# Patient Record
Sex: Female | Born: 1990 | Race: Black or African American | Hispanic: Refuse to answer | Marital: Single
Health system: Midwestern US, Community
[De-identification: ages and names within clinical notes are randomized; demographics above are authoritative.]

## PROBLEM LIST (undated history)

## (undated) ENCOUNTER — Inpatient Hospital Stay (HOSPITAL_COMMUNITY): Payer: Self-pay

## (undated) DIAGNOSIS — N938 Other specified abnormal uterine and vaginal bleeding: Secondary | ICD-10-CM

## (undated) DIAGNOSIS — E785 Hyperlipidemia, unspecified: Secondary | ICD-10-CM

## (undated) DIAGNOSIS — I10 Essential (primary) hypertension: Secondary | ICD-10-CM

## (undated) DIAGNOSIS — R519 Headache, unspecified: Secondary | ICD-10-CM

## (undated) DIAGNOSIS — R51 Headache: Secondary | ICD-10-CM

## (undated) HISTORY — PX: NO PAST SURGERIES: SHX2092

## (undated) HISTORY — DX: Essential (primary) hypertension: I10

## (undated) HISTORY — DX: Hyperlipidemia, unspecified: E78.5

---

## 2010-05-17 ENCOUNTER — Emergency Department: Payer: Self-pay | Admitting: Emergency Medicine

## 2011-03-12 LAB — URINALYSIS W/ RFLX MICROSCOPIC
Bilirubin: NEGATIVE
Glucose: NEGATIVE MG/DL
Ketone: NEGATIVE MG/DL
Leukocyte Esterase: NEGATIVE
Nitrites: NEGATIVE
Protein: NEGATIVE MG/DL
Specific gravity: >1.03 — ABNORMAL HIGH (ref 1.003–1.030)
Urobilinogen: 0.2 EU/DL (ref 0.2–1.0)
pH (UA): 6 (ref 5.0–8.0)

## 2011-03-12 LAB — CBC WITH AUTOMATED DIFF
ABS. BASOPHILS: 0.1 10*3/uL — ABNORMAL HIGH (ref 0.0–0.06)
ABS. EOSINOPHILS: 0.5 10*3/uL — ABNORMAL HIGH (ref 0.0–0.4)
ABS. LYMPHOCYTES: 3.4 10*3/uL (ref 0.9–3.6)
ABS. MONOCYTES: 0.8 10*3/uL (ref 0.05–1.2)
ABS. NEUTROPHILS: 4.6 10*3/uL (ref 1.8–8.0)
BASOPHILS: 1 % (ref 0–2)
EOSINOPHILS: 5 % (ref 0–5)
HCT: 36.2 % (ref 35.0–45.0)
HGB: 12.1 g/dL (ref 12.0–16.0)
LYMPHOCYTES: 37 % (ref 21–52)
MCH: 26.8 PG (ref 24.0–34.0)
MCHC: 33.4 g/dL (ref 31.0–37.0)
MCV: 80.3 FL (ref 74.0–97.0)
MONOCYTES: 8 % (ref 3–10)
MPV: 11.1 FL (ref 9.2–11.8)
NEUTROPHILS: 49 % (ref 40–73)
PLATELET: 228 10*3/uL (ref 135–420)
RBC: 4.51 M/uL (ref 4.20–5.30)
RDW: 12 % (ref 11.6–14.5)
WBC: 9.3 10*3/uL (ref 4.6–13.2)

## 2011-03-12 LAB — URINE MICROSCOPIC ONLY: WBC: NEGATIVE /HPF (ref 0–4)

## 2011-03-12 LAB — HCG URINE, QL: HCG urine, QL: NEGATIVE

## 2011-03-12 LAB — WET PREP: Wet prep: NONE SEEN

## 2011-03-14 LAB — CHLAMYDIA/GC PCR
Chlamydia amplified: NEGATIVE
N. gonorrhea, amplified: NEGATIVE

## 2012-02-23 ENCOUNTER — Encounter

## 2012-05-12 ENCOUNTER — Encounter

## 2013-08-29 ENCOUNTER — Emergency Department: Payer: Self-pay | Admitting: Emergency Medicine

## 2013-08-29 LAB — COMPREHENSIVE METABOLIC PANEL
Alkaline Phosphatase: 56 U/L
BUN: 11 mg/dL (ref 7–18)
Bilirubin,Total: 0.3 mg/dL (ref 0.2–1.0)
Co2: 27 mmol/L (ref 21–32)
Creatinine: 0.89 mg/dL (ref 0.60–1.30)
EGFR (Non-African Amer.): >60
Glucose: 83 mg/dL (ref 65–99)
Potassium: 3.1 mmol/L — ABNORMAL LOW (ref 3.5–5.1)
SGPT (ALT): 17 U/L (ref 12–78)
Sodium: 137 mmol/L (ref 136–145)

## 2013-08-29 LAB — CBC
HCT: 36.4 % (ref 35.0–47.0)
MCH: 27.3 pg (ref 26.0–34.0)
MCHC: 32.9 g/dL (ref 32.0–36.0)
MCV: 83 fL (ref 80–100)
Platelet: 200 10*3/uL (ref 150–440)
RBC: 4.39 10*6/uL (ref 3.80–5.20)
WBC: 8.3 10*3/uL (ref 3.6–11.0)

## 2013-09-08 ENCOUNTER — Emergency Department (HOSPITAL_COMMUNITY)
Admission: EM | Admit: 2013-09-08 | Discharge: 2013-09-08 | Disposition: A | Discharge status: Home / Self Care | Payer: Self-pay | Attending: Emergency Medicine | Admitting: Emergency Medicine

## 2013-09-08 ENCOUNTER — Encounter (HOSPITAL_COMMUNITY): Payer: Self-pay | Admitting: Emergency Medicine

## 2013-09-08 ENCOUNTER — Emergency Department (HOSPITAL_COMMUNITY): Payer: Self-pay

## 2013-09-08 DIAGNOSIS — Z880 Allergy status to penicillin: Secondary | ICD-10-CM | POA: Insufficient documentation

## 2013-09-08 DIAGNOSIS — N760 Acute vaginitis: Secondary | ICD-10-CM | POA: Insufficient documentation

## 2013-09-08 DIAGNOSIS — B9689 Other specified bacterial agents as the cause of diseases classified elsewhere: Secondary | ICD-10-CM

## 2013-09-08 DIAGNOSIS — O2390 Unspecified genitourinary tract infection in pregnancy, unspecified trimester: Secondary | ICD-10-CM | POA: Insufficient documentation

## 2013-09-08 DIAGNOSIS — O200 Threatened abortion: Secondary | ICD-10-CM | POA: Insufficient documentation

## 2013-09-08 LAB — WET PREP, GENITAL
Trich, Wet Prep: NONE SEEN
Yeast Wet Prep HPF POC: NONE SEEN

## 2013-09-08 LAB — CBC
HCT: 35.5 % — ABNORMAL LOW (ref 36.0–46.0)
Hemoglobin: 11.8 g/dL — ABNORMAL LOW (ref 12.0–15.0)
MCH: 27.4 pg (ref 26.0–34.0)
MCHC: 33.2 g/dL (ref 30.0–36.0)
MCV: 82.4 fL (ref 78.0–100.0)
RDW: 12.7 % (ref 11.5–15.5)

## 2013-09-08 LAB — POCT PREGNANCY, URINE: Preg Test, Ur: POSITIVE — AB

## 2013-09-08 LAB — URINALYSIS, ROUTINE W REFLEX MICROSCOPIC
Bilirubin Urine: NEGATIVE
Glucose, UA: NEGATIVE mg/dL
Hgb urine dipstick: NEGATIVE
Ketones, ur: NEGATIVE mg/dL
Leukocytes, UA: NEGATIVE
Nitrite: NEGATIVE
pH: 5 (ref 5.0–8.0)

## 2013-09-08 MED ORDER — METRONIDAZOLE 1 % EX CREA: TOPICAL_CREAM | Freq: Every day | CUTANEOUS | Status: DC

## 2013-09-08 NOTE — ED Provider Notes (Signed)
Medical screening examination/treatment/procedure(s) were performed by non-physician practitioner and as supervising physician I was immediately available for consultation/collaboration.  EKG Interpretation   None         Gwyneth Sprout, MD 09/08/13 2142

## 2013-09-08 NOTE — ED Provider Notes (Signed)
Pt signed out to me at shift change. Pt with positive pregnancy test last week, today with lower abdominal cramping and vaginal bleeding. Pt's evaluation showed positive pregnancy test here in ED, US showed intrauterine gestational sac measuring 6w and 0d, no embryo visualized. Pt's HCG quant pending.    9:34 PM HCG back. PT will be d/c home with close GYN follow up. Discussed results with pt, questions answered.    Lottie Mussel, PA-C 09/08/13 2135

## 2013-09-08 NOTE — ED Notes (Signed)
Pt reports a positive pregnancy test last weeks, she guessed she was 5-[redacted] weeks pregnant. Then today she had one episode of vaginal bleeding and cramping, she took ibuprofen which helped the cramps and the bleeding has stopped.

## 2013-09-08 NOTE — ED Provider Notes (Signed)
CSN: [id number]     Arrival date & time 09/08/13  1452 History   First MD Initiated Contact with Patient 09/08/13 1748     Chief Complaint  Patient presents with  . Vaginal Bleeding   (Consider location/radiation/quality/duration/timing/severity/associated sxs/prior Treatment) HPI Comments: 22 year old female with vaginal bleeding in the setting of a recent positive pregnancy test. She began having abdominal cramping 2 days ago. Yesterday she began having vaginal bleeding. Today, she notes that she passed a small grayish substance. She still had some cramping since then, but her vaginal bleeding has resolved.  Patient is a 22 y.o. female presenting with female genitourinary complaint.  Female GU Problem This is a new problem. The current episode started 2 days ago. The problem occurs constantly. The problem has been gradually worsening. Associated symptoms include abdominal pain (Cramping, lower abdomen, moderate). Pertinent negatives include no chest pain and no shortness of breath. Nothing aggravates the symptoms. Nothing relieves the symptoms.    History reviewed. No pertinent past medical history. History reviewed. No pertinent past surgical history. History reviewed. No pertinent family history. History  Substance Use Topics  . Smoking status: Never Smoker   . Smokeless tobacco: Not on file  . Alcohol Use: No   OB History   Grav Para Term Preterm Abortions TAB SAB Ect Mult Living                 Review of Systems  Constitutional: Negative for fever.  HENT: Negative for congestion.   Respiratory: Negative for cough and shortness of breath.   Cardiovascular: Negative for chest pain.  Gastrointestinal: Positive for abdominal pain (Cramping, lower abdomen, moderate). Negative for nausea, vomiting and diarrhea.  All other systems reviewed and are negative.    Allergies  Amoxicillin and Peach  Home Medications   Current Outpatient Rx  Name  Route  Sig  Dispense  Refill  .  ibuprofen (ADVIL,MOTRIN) 200 MG tablet   Oral   Take 600 mg by mouth daily as needed (pain).          BP 133/89  Pulse 98  Temp(Src) 98.3 F (36.8 C) (Oral)  Resp 18  SpO2 100% Physical Exam  Nursing note and vitals reviewed. Constitutional: She is oriented to person, place, and time. She appears well-developed and well-nourished. No distress.  HENT:  Head: Normocephalic and atraumatic.  Mouth/Throat: Oropharynx is clear and moist.  Eyes: Conjunctivae are normal. Pupils are equal, round, and reactive to light. No scleral icterus.  Neck: Neck supple.  Cardiovascular: Normal rate, regular rhythm, normal heart sounds and intact distal pulses.   No murmur heard. Pulmonary/Chest: Effort normal and breath sounds normal. No stridor. No respiratory distress. She has no rales.  Abdominal: Soft. Bowel sounds are normal. She exhibits no distension. There is tenderness in the right lower quadrant and suprapubic area. There is no rigidity, no rebound and no guarding.  Genitourinary: Uterus is tender (Mild). Uterus is not enlarged. Cervix exhibits discharge (white). Cervix exhibits no motion tenderness and no friability. Right adnexum displays tenderness (Mild). Right adnexum displays no mass and no fullness. Left adnexum displays no mass, no tenderness and no fullness.  Musculoskeletal: Normal range of motion.  Neurological: She is alert and oriented to person, place, and time.  Skin: Skin is warm and dry. No rash noted.  Psychiatric: She has a normal mood and affect. Her behavior is normal.    ED Course  Procedures (including critical care time) Labs Review Labs Reviewed  WET PREP, GENITAL -  Abnormal; Notable for the following:    Clue Cells Wet Prep HPF POC MANY (*)    WBC, Wet Prep HPF POC MODERATE (*)    All other components within normal limits  URINALYSIS, ROUTINE W REFLEX MICROSCOPIC - Abnormal; Notable for the following:    APPearance CLOUDY (*)    Specific Gravity, Urine 1.034  (*)    All other components within normal limits  CBC - Abnormal; Notable for the following:    Hemoglobin 11.8 (*)    HCT 35.5 (*)    All other components within normal limits  HCG, QUANTITATIVE, PREGNANCY - Abnormal; Notable for the following:    hCG, Beta Chain, Quant, S 12851 (*)    All other components within normal limits  POCT PREGNANCY, URINE - Abnormal; Notable for the following:    Preg Test, Ur POSITIVE (*)    All other components within normal limits  GC/CHLAMYDIA PROBE AMP  ABO/RH   Imaging Review US Ob Comp Less 14 Wks  09/08/2013   CLINICAL DATA:  Pelvic pain, cramping, and bleeding. Five week 5 day gestational age by LMP.  EXAM: OBSTETRIC <14 WK [place] AND TRANSVAGINAL OB US  TECHNIQUE: Both transabdominal and transvaginal ultrasound examinations were performed for complete evaluation of the gestation as well as the maternal uterus, adnexal regions, and pelvic cul-de-sac. Transvaginal technique was performed to assess early pregnancy.  COMPARISON:  None.  FINDINGS: Intrauterine gestational sac: Visualized/normal in shape.  Yolk sac:  Visualized  Embryo:  Not visualized  MSD:  12  mm   6 w   0  d  Maternal uterus/adnexae: Both ovaries are normal in appearance. No adnexal mass or free fluid identified.  IMPRESSION: Single intrauterine gestational sac measuring 6 weeks 0 days by mean sac diameter, which is concordant with LMP.  No significant maternal uterine or adnexal abnormality identified.   Electronically Signed   By: Myles Rosenthal M.D.   On: 09/08/2013 20:10   US Ob Transvaginal  09/08/2013   CLINICAL DATA:  Pelvic pain, cramping, and bleeding. Five week 5 day gestational age by LMP.  EXAM: OBSTETRIC <14 WK [place] AND TRANSVAGINAL OB US  TECHNIQUE: Both transabdominal and transvaginal ultrasound examinations were performed for complete evaluation of the gestation as well as the maternal uterus, adnexal regions, and pelvic cul-de-sac. Transvaginal technique was performed to assess early  pregnancy.  COMPARISON:  None.  FINDINGS: Intrauterine gestational sac: Visualized/normal in shape.  Yolk sac:  Visualized  Embryo:  Not visualized  MSD:  12  mm   6 w   0  d  Maternal uterus/adnexae: Both ovaries are normal in appearance. No adnexal mass or free fluid identified.  IMPRESSION: Single intrauterine gestational sac measuring 6 weeks 0 days by mean sac diameter, which is concordant with LMP.  No significant maternal uterine or adnexal abnormality identified.   Electronically Signed   By: Myles Rosenthal M.D.   On: 09/08/2013 20:10    EKG Interpretation   None       MDM   1. Threatened abortion   2. BV (bacterial vaginosis)    Positive pregnancy test.  Mild right lower and suprapubic abdominal tenderness.  Patient reports passing gray substance, which could represent an abortion. Plan ultrasound to further evaluate.  US showed intrauterine gest sac. Plan for follow up with gynecology.  Waiting for bHCG, which PA Kirichenko will follow up on.    Candyce Churn, MD 09/08/13 2207

## 2013-09-09 LAB — GC/CHLAMYDIA PROBE AMP
CT Probe RNA: NEGATIVE
GC Probe RNA: NEGATIVE

## 2014-07-31 ENCOUNTER — Encounter (HOSPITAL_COMMUNITY): Payer: Self-pay | Admitting: Emergency Medicine

## 2014-07-31 ENCOUNTER — Emergency Department (HOSPITAL_COMMUNITY)
Admission: EM | Admit: 2014-07-31 | Discharge: 2014-07-31 | Disposition: A | Discharge status: Home / Self Care | Payer: 59 | Source: Home / Self Care | Attending: Family Medicine | Admitting: Family Medicine

## 2014-07-31 DIAGNOSIS — J069 Acute upper respiratory infection, unspecified: Secondary | ICD-10-CM

## 2014-07-31 DIAGNOSIS — J040 Acute laryngitis: Secondary | ICD-10-CM

## 2014-07-31 DIAGNOSIS — H6122 Impacted cerumen, left ear: Secondary | ICD-10-CM

## 2014-07-31 LAB — POCT RAPID STREP A: STREPTOCOCCUS, GROUP A SCREEN (DIRECT): NEGATIVE

## 2014-07-31 NOTE — Discharge Instructions (Signed)
Strep test negative. Salt water gargles and tylenol as directed on packaging at home.  Upper Respiratory Infection, Adult An upper respiratory infection (URI) is also sometimes known as the common cold. The upper respiratory tract includes the nose, sinuses, throat, trachea, and bronchi. Bronchi are the airways leading to the lungs. Most people improve within 1 week, but symptoms can last up to 2 weeks. A residual cough may last even longer.  CAUSES Many different viruses can infect the tissues lining the upper respiratory tract. The tissues become irritated and inflamed and often become very moist. Mucus production is also common. A cold is contagious. You can easily spread the virus to others by oral contact. This includes kissing, sharing a glass, coughing, or sneezing. Touching your mouth or nose and then touching a surface, which is then touched by another person, can also spread the virus. SYMPTOMS  Symptoms typically develop 1 to 3 days after you come in contact with a cold virus. Symptoms vary from person to person. They may include:  Runny nose.  Sneezing.  Nasal congestion.  Sinus irritation.  Sore throat.  Loss of voice (laryngitis).  Cough.  Fatigue.  Muscle aches.  Loss of appetite.  Headache.  Low-grade fever. DIAGNOSIS  You might diagnose your own cold based on familiar symptoms, since most people get a cold 2 to 3 times a year. Your caregiver can confirm this based on your exam. Most importantly, your caregiver can check that your symptoms are not due to another disease such as strep throat, sinusitis, pneumonia, asthma, or epiglottitis. Blood tests, throat tests, and X-rays are not necessary to diagnose a common cold, but they may sometimes be helpful in excluding other more serious diseases. Your caregiver will decide if any further tests are required. RISKS AND COMPLICATIONS  You may be at risk for a more severe case of the common cold if you smoke cigarettes,  have chronic heart disease (such as heart failure) or lung disease (such as asthma), or if you have a weakened immune system. The very young and very old are also at risk for more serious infections. Bacterial sinusitis, middle ear infections, and bacterial pneumonia can complicate the common cold. The common cold can worsen asthma and chronic obstructive pulmonary disease (COPD). Sometimes, these complications can require emergency medical care and may be life-threatening. PREVENTION  The best way to protect against getting a cold is to practice good hygiene. Avoid oral or hand contact with people with cold symptoms. Wash your hands often if contact occurs. There is no clear evidence that vitamin C, vitamin E, echinacea, or exercise reduces the chance of developing a cold. However, it is always recommended to get plenty of rest and practice good nutrition. TREATMENT  Treatment is directed at relieving symptoms. There is no cure. Antibiotics are not effective, because the infection is caused by a virus, not by bacteria. Treatment may include:  Increased fluid intake. Sports drinks offer valuable electrolytes, sugars, and fluids.  Breathing heated mist or steam (vaporizer or shower).  Eating chicken soup or other clear broths, and maintaining good nutrition.  Getting plenty of rest.  Using gargles or lozenges for comfort.  Controlling fevers with ibuprofen or acetaminophen as directed by your caregiver.  Increasing usage of your inhaler if you have asthma. Zinc gel and zinc lozenges, taken in the first 24 hours of the common cold, can shorten the duration and lessen the severity of symptoms. Pain medicines may help with fever, muscle aches, and throat pain.  A variety of non-prescription medicines are available to treat congestion and runny nose. Your caregiver can make recommendations and may suggest nasal or lung inhalers for other symptoms.  HOME CARE INSTRUCTIONS   Only take over-the-counter  or prescription medicines for pain, discomfort, or fever as directed by your caregiver.  Use a warm mist humidifier or inhale steam from a shower to increase air moisture. This may keep secretions moist and make it easier to breathe.  Drink enough water and fluids to keep your urine clear or pale yellow.  Rest as needed.  Return to work when your temperature has returned to normal or as your caregiver advises. You may need to stay home longer to avoid infecting others. You can also use a face mask and careful hand washing to prevent spread of the virus. SEEK MEDICAL CARE IF:   After the first few days, you feel you are getting worse rather than better.  You need your caregiver's advice about medicines to control symptoms.  You develop chills, worsening shortness of breath, or brown or red sputum. These may be signs of pneumonia.  You develop yellow or brown nasal discharge or pain in the face, especially when you bend forward. These may be signs of sinusitis.  You develop a fever, swollen neck glands, pain with swallowing, or white areas in the back of your throat. These may be signs of strep throat. SEEK IMMEDIATE MEDICAL CARE IF:   You have a fever.  You develop severe or persistent headache, ear pain, sinus pain, or chest pain.  You develop wheezing, a prolonged cough, cough up blood, or have a change in your usual mucus (if you have chronic lung disease).  You develop sore muscles or a stiff neck. Document Released: 03/18/2001 Document Revised: 12/15/2011 Document Reviewed: 12/28/2013 [facility] Patient Information 2015 [place]ExitCare, [place]. This information is not intended to replace advice given to you by your health care provider. Make sure you discuss any questions you have with your health care provider.  Cerumen Impaction A cerumen impaction is when the wax in your ear forms a plug. This plug usually causes reduced hearing. Sometimes it also causes an earache or dizziness.  Removing a cerumen impaction can be difficult and painful. The wax sticks to the ear canal. The canal is sensitive and bleeds easily. If you try to remove a heavy wax buildup with a cotton tipped swab, you may push it in further. Irrigation with water, suction, and small ear curettes may be used to clear out the wax. If the impaction is fixed to the skin in the ear canal, ear drops may be needed for a few days to loosen the wax. People who build up a lot of wax frequently can use ear wax removal products available in your local drugstore. SEEK MEDICAL CARE IF:  You develop an earache, increased hearing loss, or marked dizziness. Document Released: 10/30/2004 Document Revised: 12/15/2011 Document Reviewed: 12/20/2009 [facility] CenterExit[facility] Patient Information 2015 [place], [place]. This information is not intended to replace advice given to you by your health care provider. Make sure you discuss any questions you have with your health care provider.  Laryngitis At the top of your windpipe is your voice box. It is the source of your voice. Inside your voice box are 2 bands of muscles called vocal cords. When you breathe, your vocal cords are relaxed and open so that air can get into the lungs. When you decide to say something, these cords come together and vibrate. The sound  from these vibrations goes into your throat and comes out through your mouth as sound. Laryngitis is an inflammation of the vocal cords that causes hoarseness, cough, loss of voice, sore throat, and dry throat. Laryngitis can be temporary (acute) or long-term (chronic). Most cases of acute laryngitis improve with time.Chronic laryngitis lasts for more than 3 weeks. CAUSES Laryngitis can often be related to excessive smoking, talking, or yelling, as well as inhalation of toxic fumes and allergies. Acute laryngitis is usually caused by a viral infection, vocal strain, measles or mumps, or bacterial infections. Chronic laryngitis is usually caused by  vocal cord strain, vocal cord injury, postnasal drip, growths on the vocal cords, or acid reflux. SYMPTOMS   Cough.  Sore throat.  Dry throat. RISK FACTORS  Respiratory infections.  Exposure to irritating substances, such as cigarette smoke, excessive amounts of alcohol, stomach acids, and workplace chemicals.  Voice trauma, such as vocal cord injury from shouting or speaking too loud. DIAGNOSIS  Your cargiver will perform a physical exam. During the physical exam, your caregiver will examine your throat. The most common sign of laryngitis is hoarseness. Laryngoscopy may be necessary to confirm the diagnosis of this condition. This procedure allows your caregiver to look into the larynx. HOME CARE INSTRUCTIONS  Drink enough fluids to keep your urine clear or pale yellow.  Rest until you no longer have symptoms or as directed by your caregiver.  Breathe in moist air.  Take all medicine as directed by your caregiver.  Do not smoke.  Talk as little as possible (this includes whispering).  Write on paper instead of talking until your voice is back to normal.  Follow up with your caregiver if your condition has not improved after 10 days. SEEK MEDICAL CARE IF:   You have trouble breathing.  You cough up blood.  You have persistent fever.  You have increasing pain.  You have difficulty swallowing. MAKE SURE YOU:  Understand these instructions.  Will watch your condition.  Will get help right away if you are not doing well or get worse. Document Released: 09/22/2005 Document Revised: 12/15/2011 Document Reviewed: 11/28/2010 [facility] New BraunfelsExitCare Patient Information 2015 [place]Exit[place], [place]LL[place]. This information is not intended to replace advice given to you by your health care provider. Make sure you discuss any questions you have with your health care provider.

## 2014-07-31 NOTE — ED Provider Notes (Signed)
CSN: [id number]     Arrival date & time 07/31/14  1931 History   First MD Initiated Contact with Patient 07/31/14 1948     Chief Complaint  Patient presents with  . Sore Throat   (Consider location/radiation/quality/duration/timing/severity/associated sxs/prior Treatment) HPI Comments: Denies fever, cough. PCP: none   Patient is a 23 y.o. female presenting with pharyngitis. The history is provided by the patient.  Sore Throat This is a new problem. The current episode started yesterday. The problem occurs constantly. The problem has not changed since onset.Associated symptoms comments: +hoarse voice and left ear discomfort.    History reviewed. No pertinent past medical history. History reviewed. No pertinent past surgical history. History reviewed. No pertinent family history. History  Substance Use Topics  . Smoking status: Never Smoker   . Smokeless tobacco: Not on file  . Alcohol Use: No   OB History   Grav Para Term Preterm Abortions TAB SAB Ect Mult Living                 Review of Systems  All other systems reviewed and are negative.   Allergies  Amoxicillin and Peach  Home Medications   Prior to Admission medications   Medication Sig Start Date End Date Taking? Authorizing Provider  ibuprofen (ADVIL,MOTRIN) 200 MG tablet Take 600 mg by mouth daily as needed (pain).    Historical Provider, MD  metronidazole (NORITATE) 1 % cream Apply topically daily. 09/08/13   Tatyana A Kirichenko, PA-C   BP 144/106  Pulse 90  Temp(Src) 99.1 F (37.3 C) (Oral)  Resp 14  SpO2 100% Physical Exam  Nursing note and vitals reviewed. Constitutional: She is oriented to person, place, and time. She appears well-developed and well-nourished. No distress.  HENT:  Head: Normocephalic and atraumatic.  Right Ear: Hearing, tympanic membrane, external ear and ear canal normal.  Left Ear: Hearing and external ear normal.  Nose: Nose normal.  Mouth/Throat: Uvula is midline,  oropharynx is clear and moist and mucous membranes are normal.  +cerumen impaction on left  Eyes: Conjunctivae are normal. Right eye exhibits no discharge. Left eye exhibits no discharge. No scleral icterus.  Neck: Normal range of motion. Neck supple.  Cardiovascular: Normal rate, regular rhythm and normal heart sounds.   Pulmonary/Chest: Effort normal and breath sounds normal. No stridor. No respiratory distress. She has no wheezes.  Musculoskeletal: Normal range of motion.  Lymphadenopathy:    She has no cervical adenopathy.  Neurological: She is alert and oriented to person, place, and time.  Skin: Skin is warm and dry. No rash noted. No erythema.  Psychiatric: She has a normal mood and affect. Her behavior is normal.    ED Course  Procedures (including critical care time) Labs Review Labs Reviewed  POCT RAPID STREP A ([facility] CARE ONLY)    Imaging Review No results found.   MDM   1. Cerumen impaction, left   2. URI (upper respiratory infection)   3. Laryngitis    Rapid strep negative. Viral URI with associated laryngitis. Cerumen irrigated from left ear with normal TM viewed following irrigation. Symptomatic care at home. Expect improvement over next few days.   Ria ClockJennifer Lee H Presson, GeorgiaPA 07/31/14 2032

## 2014-07-31 NOTE — ED Notes (Signed)
C/o sore throat, ear pain since yesterday

## 2014-08-01 NOTE — ED Provider Notes (Signed)
Medical screening examination/treatment/procedure(s) were performed by resident physician or non-physician practitioner and as supervising physician I was immediately available for consultation/collaboration.   Barkley BrunsKINDL,Tammara Massing DOUGLAS MD.   Linna HoffJames D Abbigael Detlefsen, MD 08/01/14 1016

## 2014-08-02 LAB — CULTURE, GROUP A STREP

## 2014-10-17 ENCOUNTER — Inpatient Hospital Stay: Admit: 2014-10-17 | Payer: PRIVATE HEALTH INSURANCE

## 2014-10-17 ENCOUNTER — Ambulatory Visit: Admit: 2014-10-17 | Discharge: 2014-10-17 | Payer: PRIVATE HEALTH INSURANCE | Attending: Nurse Practitioner

## 2014-10-17 DIAGNOSIS — R030 Elevated blood-pressure reading, without diagnosis of hypertension: Secondary | ICD-10-CM

## 2014-10-17 DIAGNOSIS — I10 Essential (primary) hypertension: Secondary | ICD-10-CM

## 2014-10-17 LAB — METABOLIC PANEL, COMPREHENSIVE
A-G Ratio: 0.9 (ref 0.8–1.7)
ALT (SGPT): 12 U/L — ABNORMAL LOW (ref 13–56)
AST (SGOT): 11 U/L — ABNORMAL LOW (ref 15–37)
Albumin: 3.7 g/dL (ref 3.4–5.0)
Alk. phosphatase: 60 U/L (ref 45–117)
Anion gap: 7 mmol/L (ref 3.0–18)
BUN/Creatinine ratio: 12 (ref 12–20)
BUN: 9 MG/DL (ref 7.0–18)
Bilirubin, total: 0.5 MG/DL (ref 0.2–1.0)
CO2: 26 mmol/L (ref 21–32)
Calcium: 8.4 MG/DL — ABNORMAL LOW (ref 8.5–10.1)
Chloride: 108 mmol/L (ref 100–108)
Creatinine: 0.77 MG/DL (ref 0.6–1.3)
GFR est AA: >60 mL/min/{1.73_m2} (ref >60)
GFR est non-AA: >60 mL/min/{1.73_m2} (ref >60)
Globulin: 3.9 g/dL (ref 2.0–4.0)
Glucose: 76 mg/dL (ref 74–99)
Potassium: 3.8 mmol/L (ref 3.5–5.5)
Protein, total: 7.6 g/dL (ref 6.4–8.2)
Sodium: 141 mmol/L (ref 136–145)

## 2014-10-17 LAB — URINALYSIS W/ RFLX MICROSCOPIC
Bilirubin: NEGATIVE
Blood: NEGATIVE
Glucose: NEGATIVE mg/dL
Ketone: NEGATIVE mg/dL
Leukocyte Esterase: NEGATIVE
Nitrites: NEGATIVE
Protein: NEGATIVE mg/dL
Specific gravity: 1.025 (ref 1.003–1.030)
Urobilinogen: 0.2 EU/dL (ref 0.2–1.0)
pH (UA): 7 (ref 5.0–8.0)

## 2014-10-17 LAB — CBC WITH AUTOMATED DIFF
ABS. BASOPHILS: 0 10*3/uL (ref 0.0–0.06)
ABS. EOSINOPHILS: 0.1 10*3/uL (ref 0.0–0.4)
ABS. LYMPHOCYTES: 1.9 10*3/uL (ref 0.9–3.6)
ABS. MONOCYTES: 0.5 10*3/uL (ref 0.05–1.2)
ABS. NEUTROPHILS: 5.2 10*3/uL (ref 1.8–8.0)
BASOPHILS: 1 % (ref 0–2)
EOSINOPHILS: 2 % (ref 0–5)
HCT: 39.4 % (ref 35.0–45.0)
HGB: 12.7 g/dL (ref 12.0–16.0)
LYMPHOCYTES: 25 % (ref 21–52)
MCH: 26.8 PG (ref 24.0–34.0)
MCHC: 32.2 g/dL (ref 31.0–37.0)
MCV: 83.3 FL (ref 74.0–97.0)
MONOCYTES: 6 % (ref 3–10)
MPV: 10.8 FL (ref 9.2–11.8)
NEUTROPHILS: 66 % (ref 40–73)
PLATELET: 272 10*3/uL (ref 135–420)
RBC: 4.73 M/uL (ref 4.20–5.30)
RDW: 13.2 % (ref 11.6–14.5)
WBC: 7.8 10*3/uL (ref 4.6–13.2)

## 2014-10-17 LAB — LIPID PANEL
CHOL/HDL Ratio: 1.9 (ref 0–5.0)
Cholesterol, total: 152 MG/DL (ref <200)
HDL Cholesterol: 79 MG/DL — ABNORMAL HIGH (ref 40–60)
LDL, calculated: 65.4 MG/DL (ref 0–100)
Triglyceride: 38 MG/DL (ref <150)
VLDL, calculated: 7.6 MG/DL

## 2014-10-17 LAB — T4, FREE: T4, Free: 1 NG/DL (ref 0.7–1.5)

## 2014-10-17 LAB — TSH 3RD GENERATION: TSH: 0.76 u[IU]/mL (ref 0.36–3.74)

## 2014-10-17 MED ORDER — LISINOPRIL 10 MG TAB
10 mg | ORAL_TABLET | Freq: Every day | ORAL | Status: DC
Start: 2014-10-17 — End: 2014-10-30

## 2014-10-17 NOTE — Patient Instructions (Addendum)
MyChart Activation    Thank you for requesting access to MyChart. Please follow the instructions below to securely access and download your online medical record. MyChart allows you to send messages to your doctor, view your test results, renew your prescriptions, schedule appointments, and more.    How Do I Sign Up?    1. In your internet browser, go to [email or web address]  2. Click on the First Time User? Click Here link in the Sign In box. You will be redirect to the New Member Sign Up page.  3. Enter your MyChart Access Code exactly as it appears below. You will not need to use this code after you???ve completed the sign-up process. If you do not sign up before the expiration date, you must request a new code.    MyChart Access Code: [email or web address]  Expires: 01/15/2015 10:52 AM (This is the date your MyChart access code will expire)    4. Enter the last four digits of your Social Security Number (xxxx) and Date of Birth (mm/dd/yyyy) as indicated and click Submit. You will be taken to the next sign-up page.  5. Create a MyChart ID. This will be your MyChart login ID and cannot be changed, so think of one that is secure and easy to remember.  6. Create a MyChart password. You can change your password at any time.  7. Enter your Password Reset Question and Answer. This can be used at a later time if you forget your password.   8. Enter your e-mail address. You will receive e-mail notification when new information is available in MyChart.  9. Click Sign Up. You can now view and download portions of your medical record.  10. Click the Download Summary menu link to download a portable copy of your medical information.    Additional Information    If you have questions, please visit the Frequently Asked Questions section of the MyChart website at [email or web address] Remember, MyChart is NOT to be used for urgent needs. For medical emergencies, dial 911.       Elevated Blood Pressure: Care Instructions  Your Care Instructions     Blood pressure is a measure of how hard the blood pushes against the walls of your arteries. It's normal for blood pressure to go up and down throughout the day. But if it stays up over time, you have high blood pressure.  Two numbers tell you your blood pressure. The first number is the systolic pressure. It shows how hard the blood pushes when your heart is pumping. The second number is the diastolic pressure. It shows how hard the blood pushes between heartbeats, when your heart is relaxed and filling with blood. An ideal blood pressure in adults is less than 120/80 (say "120 over 80"). High blood pressure is 140/90 or higher.  The main test for high blood pressure is simple, fast, and painless. To diagnose high blood pressure, your doctor will test your blood pressure at different times. You may have to check your blood pressure at home if there is reason to think that the results in the doctor's office aren't accurate.  If you are diagnosed with high blood pressure, you can work with your doctor to make a long-term plan to manage it.  Follow-up care is a key part of your treatment and safety. Be sure to make and go to all appointments, and call your doctor if you are having problems. It's also a good idea to know your test results and keep a list  of the medicines you take.  How can you care for yourself at home?  ?? Do not smoke. Smoking increases your risk for heart attack and stroke. If you need help quitting, talk to your doctor about stop-smoking programs and medicines. These can increase your chances of quitting for good.  ?? Stay at a healthy weight.  ?? Try to limit how much sodium you eat to less than 2,300 milligrams (mg) a day. Your doctor may ask you to try to eat less than 1,500 mg a day.  ?? Be physically active. Get at least 30 minutes of exercise on most days of the week. Walking is a good choice. You also may want to do other  activities, such as running, swimming, cycling, or playing tennis or team sports.  ?? Avoid or limit alcohol. Talk to your doctor about whether you can drink any alcohol.  ?? Eat plenty of fruits, vegetables, and low-fat dairy products. Eat less saturated and total fats.  ?? Learn how to check your blood pressure at home.  When should you call for help?  Call your doctor now or seek immediate medical care if:  ?? Your blood pressure is much higher than normal (such as 180/110 or higher).  ?? You think high blood pressure is causing symptoms such as:  ?? Severe headache.  ?? Blurry vision.  Watch closely for changes in your health, and be sure to contact your doctor if:  ?? You do not get better as expected.   Where can you learn more?   Go to [email or web address]  Enter [phone] in the search box to learn more about "Elevated Blood Pressure: Care Instructions."   ?? 2006-2015 Healthwise, Incorporated. Care instructions adapted under license by [employer] (which disclaims liability or warranty for this information). This care instruction is for use with your licensed healthcare professional. If you have questions about a medical condition or this instruction, always ask your healthcare professional. Healthwise, Incorporated disclaims any warranty or liability for your use of this information.  Content Version: 10.7.482551; Current as of: January 16, 2014              High Blood Pressure: Care Instructions  Your Care Instructions  If your blood pressure is usually above 140/90, you have high blood pressure, or hypertension. Despite what a lot of people think, high blood pressure usually doesn't cause headaches or make you feel dizzy or lightheaded. It usually has no symptoms. But it does increase your risk for heart attack, stroke, and kidney or eye damage. The higher your blood pressure, the more your risk increases.  Your doctor will give you a goal for your blood pressure. Your goal will  be based on your health and your age. An example of a goal is to keep your blood pressure below 140/90.  Lifestyle changes, such as eating healthy and being active, are always important to help lower blood pressure. You might also take medicine to reach your blood pressure goal.  Follow-up care is a key part of your treatment and safety. Be sure to make and go to all appointments, and call your doctor if you are having problems. It's also a good idea to know your test results and keep a list of the medicines you take.  How can you care for yourself at home?  Medical treatment  ?? If you stop taking your medicine, your blood pressure will go back up. You may take one or more types of medicine to lower your  blood pressure. Be safe with medicines. Take your medicine exactly as prescribed. Call your doctor if you think you are having a problem with your medicine.  ?? Your doctor may suggest that you take one low-dose aspirin (81 mg) a day. This can help reduce your risk of having a stroke or heart attack.  ?? See your doctor regularly. You may need to see the doctor more often at first or until your blood pressure comes down.  ?? If you are taking blood pressure medicine, talk to your doctor before you take decongestants or anti-inflammatory medicine, such as ibuprofen. Some of these medicines can raise blood pressure.  ?? Learn how to check your blood pressure at home.  Lifestyle changes  ?? Stay at a healthy weight. This is especially important if you put on weight around the waist. Losing even 10 pounds can help you lower your blood pressure.  ?? If your doctor recommends it, get more exercise. Walking is a good choice. Bit by bit, increase the amount you walk every day. Try for at least 30 minutes on most days of the week. You also may want to swim, bike, or do other activities.  ?? Avoid or limit alcohol. Talk to your doctor about whether you can drink any alcohol.   ?? Try to limit how much sodium you eat to less than 2,300 milligrams (mg) a day. Your doctor may ask you to try to eat less than 1,500 mg a day.  ?? Eat plenty of fruits (such as bananas and oranges), vegetables, legumes, whole grains, and low-fat dairy products.  ?? Lower the amount of saturated fat in your diet. Saturated fat is found in animal products such as milk, cheese, and meat. Limiting these foods may help you lose weight and also lower your risk for heart disease.  ?? Do not smoke. Smoking increases your risk for heart attack and stroke. If you need help quitting, talk to your doctor about stop-smoking programs and medicines. These can increase your chances of quitting for good.  When should you call for help?  Call your doctor now or seek immediate medical care if:  ?? Your blood pressure is much higher than normal (such as 180/110 or higher).  ?? You think high blood pressure is causing symptoms such as:  ?? Severe headache.  ?? Blurry vision.  Watch closely for changes in your health, and be sure to contact your doctor if:  ?? You do not get better as expected.   Where can you learn more?   Go to [email or web address]  Enter [phone] in the search box to learn more about "High Blood Pressure: Care Instructions."   ?? 2006-2015 Healthwise, Incorporated. Care instructions adapted under license by [employer] (which disclaims liability or warranty for this information). This care instruction is for use with your licensed healthcare professional. If you have questions about a medical condition or this instruction, always ask your healthcare professional. Healthwise, Incorporated disclaims any warranty or liability for your use of this information.  Content Version: 10.7.482551; Current as of: November 25, 2013

## 2014-10-17 NOTE — Progress Notes (Signed)
Chief Complaint   Patient presents with   ??? Establish Care     Pt is here today to establish care with a PCP.  Pt has a history of HTN.  Her father also had a history of HTN at a young age.   ??? Hypertension

## 2014-10-17 NOTE — Progress Notes (Signed)
Subjective:     Kayla Huff is a 24 y.o. female who presents for follow up of hypertension.    Diet and Lifestyle: not attempting to follow a low fat, low cholesterol diet, not attempting to follow a low sodium diet, exercises sporadically  Home BP Monitoring: is not measured at home    Cardiovascular ROS: taking medications as instructed, no medication side effects noted, no TIA's, no chest pain on exertion, no dyspnea on exertion, no swelling of ankles.     New concerns: Kayla Huff reports Kayla Huff father has a history of HTN onset at a very young age.  Kayla Huff mother is present with Kayla Huff today at the visit and reports Kayla Huff has had abnormal blood pressures since 2012 but has not followed up and been seen so Kayla Huff brought Kayla Huff in today to be seen.     Patient Active Problem List   Diagnosis Code   ??? Elevated blood pressure R03.0   ??? Hypertension I10     Current Outpatient Prescriptions   Medication Sig Dispense Refill   ??? medroxyPROGESTERone (DEPO-PROVERA) 150 mg/mL injection 150 mg by IntraMUSCular route every three (3) months.     ??? lisinopril (PRINIVIL, ZESTRIL) 10 mg tablet Take 1 Tab by mouth daily. 30 Tab 3     Allergies   Allergen Reactions   ??? Amoxicillin Rash     Past Medical History   Diagnosis Date   ??? Hypertension      History reviewed. No pertinent past surgical history.  Family History   Problem Relation Age of Onset   ??? No Known Problems Mother    ??? Hypertension Father    ??? Stroke Maternal Grandmother 20   ??? High Cholesterol Father    ??? Diabetes Maternal Grandmother    ??? Diabetes Paternal Grandmother    ??? Hypertension Maternal Grandmother    ??? Hypertension Maternal Grandfather    ??? Hypertension Paternal Grandmother    ??? Hypertension Paternal Grandfather    ??? Kidney Disease Maternal Grandmother      History   Substance Use Topics   ??? Smoking status: Never Smoker    ??? Smokeless tobacco: Not on file   ??? Alcohol Use: 0.0 oz/week     0 Not specified per week      Comment: occasionally      Lab Results   Component Value Date/Time   WBC 7.8 10/17/2014 12:09 PM   HGB 12.7 10/17/2014 12:09 PM   HCT 39.4 10/17/2014 12:09 PM   PLATELET 272 10/17/2014 12:09 PM   MCV 83.3 10/17/2014 12:09 PM     Lab Results  Component Value Date/Time   GLUCOSE 76 10/17/2014 12:09 PM   LDL, CALCULATED PENDING 10/17/2014 12:09 PM   CREATININE 0.77 10/17/2014 12:09 PM      Lab Results  Component Value Date/Time   CHOLESTEROL, TOTAL PENDING 10/17/2014 12:09 PM   HDL CHOLESTEROL PENDING 10/17/2014 12:09 PM   LDL, CALCULATED PENDING 10/17/2014 12:09 PM   TRIGLYCERIDE 38 10/17/2014 12:09 PM   CHOL/HDL RATIO PENDING 10/17/2014 12:09 PM     Lab Results  Component Value Date/Time   ALT 12 10/17/2014 12:09 PM   AST 11 10/17/2014 12:09 PM   ALK. PHOSPHATASE PENDING 10/17/2014 12:09 PM   BILIRUBIN, TOTAL PENDING 10/17/2014 12:09 PM     No results found for: INR, PTMR, PTP, PT1, PT2   Lab Results  Component Value Date/Time   GFR EST AA >60 10/17/2014 12:09 PM   GFR EST NON-AA >60  10/17/2014 12:09 PM   CREATININE 0.77 10/17/2014 12:09 PM   BUN 9 10/17/2014 12:09 PM   SODIUM 141 10/17/2014 12:09 PM   POTASSIUM 3.8 10/17/2014 12:09 PM   CHLORIDE 108 10/17/2014 12:09 PM   CO2 26 10/17/2014 12:09 PM      No results found for: PSA, PSA2, PSAR1, PSA1, PSAR2, PSA3, PSAR3, [id number], [id number], PSALT    No results found for: TSH, TSH2, TSH3, TSHP, TSHELE, TSHEXT, TT3, T3U, T3UP, FRT3, FT3, FT4, FT4P, T4, T4P, FT4T, TT7, TSHEXT     Lab Results   Component Value Date/Time    GLUCOSE 76 10/17/2014 12:09 PM       Review of Systems, additional:  Pertinent items are noted in HPI.    Objective:     BP 142/95 mmHg   Pulse 80   Temp(Src) 98 ??F (36.7 ??C) (Oral)   Resp 20   Ht 5' 1"  (1.549 m)   Wt 131 lb (59.421 kg)   BMI 24.76 kg/m2   SpO2 98%  Appearance: alert, well appearing, and in no distress.  General exam: CVS exam BP noted to be well controlled today in office, S1, S2 normal, no gallop, no murmur, chest clear, no JVD, no HSM, no edema.   Lab review: no lab studies available for review at time of visit.     Assessment/Plan:     hypertension needs improvement.  reviewed diet, exercise and weight control  reviewed medications and side effects in detail  reviewed potential future medication changes and side effects.   Kayla Huff was seen today for establish care and hypertension.    Diagnoses and all orders for this visit:    Essential hypertension  Orders:  -     CBC WITH AUTOMATED DIFF; Future  -     METABOLIC PANEL, COMPREHENSIVE; Future  -     URINALYSIS W/MICROSCOPIC; Future  -     MICROALBUMIN, UR, RAND; Future  -     TSH, 3RD GENERATION; Future  -     LIPID PANEL; Future  -     VITAMIN D, 1, 25 DIHYDROXY; Future  -     CULTURE, URINE; Future  -     lisinopril (PRINIVIL, ZESTRIL) 10 mg tablet; Take 1 Tab by mouth daily.  -     T3 TOTAL; Future  -     T4, FREE; Future      Plan of care reviewed with patient.  Understanding verbalized and they are in agreement with plan of care.

## 2014-10-18 LAB — CULTURE, URINE
Culture result:: 20000
Culture: 20000

## 2014-10-18 LAB — VITAMIN D, 1, 25 DIHYDROXY: Calcitriol (Vit D 1, 25 di-OH): 54.9 pg/mL (ref 19.9–79.3)

## 2014-10-18 LAB — MICROALBUMIN, UR, RAND W/ MICROALB/CREAT RATIO
Creatinine, urine random: 211.79 mg/dL — ABNORMAL HIGH (ref 30–125)
Microalbumin,urine random: 0.8 MG/DL (ref <30)
Microalbumin/Creat ratio (mg/g creat): 4 mg/g

## 2014-10-18 LAB — T3 TOTAL: T3, total: 124 ng/dL (ref 71–180)

## 2014-10-27 NOTE — Progress Notes (Signed)
Received call from pt on yesterday in reference to her being seen on 1/12 for elevated BP. Pt states she was started on a BP medication and that she was still having some elevated readings. States she has noted some blurred vision and headaches. Pt was advised to come in the office to be evaluated. 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 Pt was advised that she should try to be evaluated or seek medical attention especially if her symptoms worsen. Pt states she would. She has an appointment with Clarisse GougeBridget next week and she will keep this appointment. Pt mother also called today with concerns about her daughters elevated BP, readings of 135/97 and 137/102 and was told, her daughter was advise the above and the mother was suggesting something be called in to a pharmacy for her. Mauri BrooklynJerrica Lyle NP. Was made aware of the above and suggests the same,that she has to be elevated, that  most likely the headaches and blurred vision is not being caused by the elevated BP. When has she had an eye exam? She needs to be reassessed before new or other medication can be ordered. Pts mother understands.

## 2014-10-30 ENCOUNTER — Ambulatory Visit: Admit: 2014-10-30 | Discharge: 2014-10-30 | Payer: PRIVATE HEALTH INSURANCE | Attending: Nurse Practitioner

## 2014-10-30 DIAGNOSIS — I10 Essential (primary) hypertension: Secondary | ICD-10-CM

## 2014-10-30 MED ORDER — HYDROCHLOROTHIAZIDE 25 MG TAB
25 mg | ORAL_TABLET | Freq: Every day | ORAL | Status: DC
Start: 2014-10-30 — End: 2019-11-22

## 2014-10-30 MED ORDER — ACETAMINOPHEN 325 MG TABLET
325 mg | ORAL_TABLET | Freq: Four times a day (QID) | ORAL | Status: DC | PRN
Start: 2014-10-30 — End: 2019-11-22

## 2014-10-30 NOTE — Progress Notes (Signed)
Chief Complaint   Patient presents with   ??? Follow-up     pt prersents for HTN pt states that pt b/p has been elevated

## 2014-10-30 NOTE — Patient Instructions (Signed)
Musculoskeletal Chest Pain: Care Instructions  Your Care Instructions  Chest pain is not always a sign that something is wrong with your heart or that you have another serious problem. The doctor thinks your chest pain is caused by strained muscles or ligaments, inflamed chest cartilage, or another problem in your chest, rather than by your heart. You may need more tests to find the cause of your chest pain.  Follow-up care is a key part of your treatment and safety. Be sure to make and go to all appointments, and call your doctor if you are having problems. It???s also a good idea to know your test results and keep a list of the medicines you take.  How can you care for yourself at home?  ?? Take pain medicines exactly as directed.  ?? If the doctor gave you a prescription medicine for pain, take it as prescribed.  ?? If you are not taking a prescription pain medicine, ask your doctor if you can take an over-the-counter medicine.  ?? Rest and protect the sore area.  ?? Stop, change, or take a break from any activity that may be causing your pain or soreness.  ?? Put ice or a cold pack on the sore area for 10 to 20 minutes at a time. Try to do this every 1 to 2 hours for the next 3 days (when you are awake) or until the swelling goes down. Put a thin cloth between the ice and your skin.  ?? After 2 or 3 days, apply a heating pad set on low or a warm cloth to the area that hurts. Some doctors suggest that you go back and forth between hot and cold.  ?? Do not wrap or tape your ribs for support. This may cause you to take smaller breaths, which could increase your risk of lung problems.  ?? Mentholated creams such as Bengay or Icy Hot may soothe sore muscles. Follow the instructions on the package.  ?? Follow your doctor's instructions for exercising.  ?? Gentle stretching and massage may help you get better faster. Stretch slowly to the point just before pain begins, and hold the stretch for at  least 15 to 30 seconds. Do this 3 or 4 times a day. Stretch just after you have applied heat.  ?? As your pain gets better, slowly return to your normal activities. Any increased pain may be a sign that you need to rest a while longer.  When should you call for help?  Call 911 anytime you think you may need emergency care. For example, call if:  ?? You have chest pain or pressure. This may occur with:  ?? Sweating.  ?? Shortness of breath.  ?? Nausea or vomiting.  ?? Pain that spreads from the chest to the neck, jaw, or one or both shoulders or arms.  ?? Dizziness or lightheadedness.  ?? A fast or uneven pulse.  After calling 911, chew 1 adult-strength aspirin. Wait for an ambulance. Do not try to drive yourself.  ?? You have sudden chest pain and shortness of breath, or you cough up blood.  Call your doctor now or seek immediate medical care if:  ?? You have any trouble breathing.  ?? Your chest pain gets worse.  ?? Your chest pain occurs consistently with exercise and is relieved by rest.  Watch closely for changes in your health, and be sure to contact your doctor if:  ?? Your chest pain does not get better after 1   week.   Where can you learn more?   Go to [email or web address]  Enter V293 in the search box to learn more about "Musculoskeletal Chest Pain: Care Instructions."   ?? 2006-2015 Healthwise, Incorporated. Care instructions adapted under license by [employer] [place] (which disclaims liability or warranty for this information). This care instruction is for use with your licensed healthcare professional. If you have questions about a medical condition or this instruction, always ask your healthcare professional. Healthwise, Incorporated disclaims any warranty or liability for your use of this information.  Content Version: 10.7.482551; Current as of: August 19, 2013              Chest Pain: Care Instructions  Your Care Instructions  There are many things that can cause chest pain. Some are not serious and  will get better on their own in a few days. But some kinds of chest pain need more testing and treatment. Your doctor may have recommended a follow-up visit in the next 8 to 12 hours. If you are not getting better, you may need more tests or treatment.  Even though your doctor has released you, you still need to watch for any problems. The doctor carefully checked you, but sometimes problems can develop later. If you have new symptoms or if your symptoms do not get better, get medical care right away.  If you have worse or different chest pain or pressure that lasts more than 5 minutes or you passed out (lost consciousness), call 911 or seek other emergency help right away.   A medical visit is only one step in your treatment. Even if you feel better, you still need to do what your doctor recommends, such as going to all suggested follow-up appointments and taking medicines exactly as directed. This will help you recover and help prevent future problems.  How can you care for yourself at home?  ?? Rest until you feel better.  ?? Take your medicine exactly as prescribed. Call your doctor if you think you are having a problem with your medicine.  ?? Do not drive after taking a prescription pain medicine.  When should you call for help?  Call 911 if:   ?? You passed out (lost consciousness).  ?? You have severe difficulty breathing.  ?? You have symptoms of a heart attack. These may include:  ?? Chest pain or pressure, or a strange feeling in your chest.  ?? Sweating.  ?? Shortness of breath.  ?? Nausea or vomiting.  ?? Pain, pressure, or a strange feeling in your back, neck, jaw, or upper belly or in one or both shoulders or arms.  ?? Lightheadedness or sudden weakness.  ?? A fast or irregular heartbeat.  After you call 911, the operator may tell you to chew 1 adult-strength or 2 to 4 low-dose aspirin. Wait for an ambulance. Do not try to drive yourself.  Call your doctor today if:   ?? You have any trouble breathing.   ?? Your chest pain gets worse.  ?? You are dizzy or lightheaded, or you feel like you may faint.  ?? You are not getting better as expected.  ?? You are having new or different chest pain.   Where can you learn more?   Go to [email or web address]  Enter A120 in the search box to learn more about "Chest Pain: Care Instructions."   ?? 2006-2015 Healthwise, Incorporated. Care instructions adapted under license by [employer] [place] (which disclaims liability or warranty for this information). This care  instruction is for use with your licensed healthcare professional. If you have questions about a medical condition or this instruction, always ask your healthcare professional. Healthwise, Incorporated disclaims any warranty or liability for your use of this information.  Content Version: 10.7.482551; Current as of: Feb 24, 2014              Elevated Blood Pressure: Care Instructions  Your Care Instructions     Blood pressure is a measure of how hard the blood pushes against the walls of your arteries. It's normal for blood pressure to go up and down throughout the day. But if it stays up over time, you have high blood pressure.  Two numbers tell you your blood pressure. The first number is the systolic pressure. It shows how hard the blood pushes when your heart is pumping. The second number is the diastolic pressure. It shows how hard the blood pushes between heartbeats, when your heart is relaxed and filling with blood. An ideal blood pressure in adults is less than 120/80 (say "120 over 80"). High blood pressure is 140/90 or higher.  The main test for high blood pressure is simple, fast, and painless. To diagnose high blood pressure, your doctor will test your blood pressure at different times. You may have to check your blood pressure at home if there is reason to think that the results in the doctor's office aren't accurate.  If you are diagnosed with high blood pressure, you can work with your  doctor to make a long-term plan to manage it.  Follow-up care is a key part of your treatment and safety. Be sure to make and go to all appointments, and call your doctor if you are having problems. It's also a good idea to know your test results and keep a list of the medicines you take.  How can you care for yourself at home?  ?? Do not smoke. Smoking increases your risk for heart attack and stroke. If you need help quitting, talk to your doctor about stop-smoking programs and medicines. These can increase your chances of quitting for good.  ?? Stay at a healthy weight.  ?? Try to limit how much sodium you eat to less than 2,300 milligrams (mg) a day. Your doctor may ask you to try to eat less than 1,500 mg a day.  ?? Be physically active. Get at least 30 minutes of exercise on most days of the week. Walking is a good choice. You also may want to do other activities, such as running, swimming, cycling, or playing tennis or team sports.  ?? Avoid or limit alcohol. Talk to your doctor about whether you can drink any alcohol.  ?? Eat plenty of fruits, vegetables, and low-fat dairy products. Eat less saturated and total fats.  ?? Learn how to check your blood pressure at home.  When should you call for help?  Call your doctor now or seek immediate medical care if:  ?? Your blood pressure is much higher than normal (such as 180/110 or higher).  ?? You think high blood pressure is causing symptoms such as:  ?? Severe headache.  ?? Blurry vision.  Watch closely for changes in your health, and be sure to contact your doctor if:  ?? You do not get better as expected.   Where can you learn more?   Go to [email or web address]  Enter [phone] in the search box to learn more about "Elevated Blood Pressure: Care Instructions."   ?? 2006-2015 Healthwise, Incorporated.  Care instructions adapted under license by [employer] (which disclaims liability or warranty for this  information). This care instruction is for use with your licensed healthcare professional. If you have questions about a medical condition or this instruction, always ask your healthcare professional. Healthwise, Incorporated disclaims any warranty or liability for your use of this information.  Content Version: 10.7.482551; Current as of: January 16, 2014              Acute High Blood Pressure: Care Instructions  Your Care Instructions  Acute high blood pressure is very high blood pressure. It's a serious problem. Very high blood pressure can damage your brain, heart, eyes, and kidneys.  You may have been given medicines to lower your blood pressure. You may have gotten them as pills or through a needle in one of your veins. This is called an IV. And maybe you were given other medicines too. These can be needed when high blood pressure causes other problems.  To keep your blood pressure at a lower level, you may need to make healthy lifestyle changes. And you will probably need to take medicines.  Be sure to follow up with your doctor about your blood pressure and what you can do about it.  Follow-up care is a key part of your treatment and safety. Be sure to make and go to all appointments, and call your doctor if you are having problems. It's also a good idea to know your test results and keep a list of the medicines you take.  How can you care for yourself at home?  ?? See your doctor as often as he or she recommends. This is to make sure your blood pressure is under control. You will probably go at least 2 times a year. But it may be more often at first.  ?? Take your blood pressure medicine exactly as prescribed. You may take one or more types. They include diuretics, beta-blockers, ACE inhibitors, calcium channel blockers, and angiotensin II receptor blockers. Call your doctor if you think you are having a problem with your medicine.  ?? If you take blood pressure medicine, talk to your doctor before you take  decongestants or anti-inflammatory medicine, such as ibuprofen. These can raise blood pressure.  ?? Learn how to check your blood pressure at home. Check it often.  ?? Ask your doctor if it's okay to drink alcohol.  ?? Talk to your doctor about lifestyle changes that can help blood pressure. These include being active and not smoking.  When should you call for help?  Call 911 anytime you think you may need emergency care. For example, call if:  ?? You have symptoms of a stroke. These may include:  ?? Sudden numbness, tingling, weakness, or loss of movement in your face, arm, or leg, especially on only one side of your body.  ?? Sudden vision changes.  ?? Sudden trouble speaking.  ?? Sudden confusion or trouble understanding simple statements.  ?? Sudden problems with walking or balance.  ?? A sudden, severe headache that is different from past headaches.  Call your doctor now or seek immediate medical care if:  ?? Your blood pressure is much higher than normal (such as 180/110 or higher).  ?? You think high blood pressure is causing symptoms such as:  ?? Severe headache.  ?? Blurry vision.  Watch closely for changes in your health, and be sure to contact your doctor if you have any problems.   Where can you learn  more?   Go to [email or web address]  Enter H919 in the search box to learn more about "Acute High Blood Pressure: Care Instructions."   ?? 2006-2015 Healthwise, Incorporated. Care instructions adapted under license by [employer] (which disclaims liability or warranty for this information). This care instruction is for use with your licensed healthcare professional. If you have questions about a medical condition or this instruction, always ask your healthcare professional. Healthwise, Incorporated disclaims any warranty or liability for your use of this information.  Content Version: 10.7.482551; Current as of: January 16, 2014

## 2014-10-30 NOTE — Progress Notes (Signed)
HISTORY OF PRESENT ILLNESS  Kayla Huff is a 24 y.o. female.  HPI Comments: Patient presents to follow up for hypertension.  She is currently taking lisinopril 10 mg by mouth daily.  She is taking her medication at noon and has not taken it today.  Reports checking BP at home and reporting higher blood pressures on the medication.  She is reporting some chest discomfort with deep breathing and when she pushes on her chest and some blurred vision.  BP today in office is 143/94.      Review of Systems   Eyes: Positive for blurred vision.   Cardiovascular: Positive for chest pain.   All other systems reviewed and are negative.    Past Medical History   Diagnosis Date   ??? Hypertension      History reviewed. No pertinent past surgical history.  Current Outpatient Prescriptions on File Prior to Visit   Medication Sig Dispense Refill   ??? medroxyPROGESTERone (DEPO-PROVERA) 150 mg/mL injection 150 mg by IntraMUSCular route every three (3) months.       No current facility-administered medications on file prior to visit.     Allergies and Intolerances:   Allergies   Allergen Reactions   ??? Amoxicillin Rash     Family History:   Family History   Problem Relation Age of Onset   ??? No Known Problems Mother    ??? Hypertension Father    ??? Stroke Maternal Grandmother 24   ??? High Cholesterol Father    ??? Diabetes Maternal Grandmother    ??? Diabetes Paternal Grandmother    ??? Hypertension Maternal Grandmother    ??? Hypertension Maternal Grandfather    ??? Hypertension Paternal Grandmother    ??? Hypertension Paternal Grandfather    ??? Kidney Disease Maternal Grandmother      Social History:   She  reports that she has never smoked. She does not have any smokeless tobacco history on file. She  reports that she drinks alcohol.  Vitals: BP 143/94 mmHg   Pulse 82   Temp(Src) 98 ??F (36.7 ??C) (Oral)   Resp 18   Ht 5' 1"  (1.549 m)   Wt 130 lb (58.968 kg)   BMI 24.58 kg/m2   SpO2 98%   LMP 10/02/2014  Body surface area is 1.59 meters squared.   BP Readings from Last 1 Encounters:   10/30/14 143/94     Wt Readings from Last 1 Encounters:   10/30/14 130 lb (58.968 kg)     CBC: Lab Results  Component Value Date/Time   WBC 7.8 10/17/2014 12:09 PM   HGB 12.7 10/17/2014 12:09 PM   HCT 39.4 10/17/2014 12:09 PM   PLATELET 272 10/17/2014 12:09 PM   MCV 83.3 10/17/2014 12:09 PM     BMP:   Lab Results   Component Value Date/Time    SODIUM 141 10/17/2014 12:09 PM    POTASSIUM 3.8 10/17/2014 12:09 PM    CHLORIDE 108 10/17/2014 12:09 PM    CO2 26 10/17/2014 12:09 PM    ANION GAP 7 10/17/2014 12:09 PM    GLUCOSE 76 10/17/2014 12:09 PM    BUN 9 10/17/2014 12:09 PM    CREATININE 0.77 10/17/2014 12:09 PM    BUN/CREATININE RATIO 12 10/17/2014 12:09 PM    GFR EST AA >60 10/17/2014 12:09 PM    GFR EST NON-AA >60 10/17/2014 12:09 PM    CALCIUM 8.4 10/17/2014 12:09 PM      CMP:    Lab Results  Component Value Date/Time    SODIUM 141 10/17/2014 12:09 PM    POTASSIUM 3.8 10/17/2014 12:09 PM    CHLORIDE 108 10/17/2014 12:09 PM    CO2 26 10/17/2014 12:09 PM    ANION GAP 7 10/17/2014 12:09 PM    GLUCOSE 76 10/17/2014 12:09 PM    BUN 9 10/17/2014 12:09 PM    CREATININE 0.77 10/17/2014 12:09 PM    BUN/CREATININE RATIO 12 10/17/2014 12:09 PM    GFR EST AA >60 10/17/2014 12:09 PM    GFR EST NON-AA >60 10/17/2014 12:09 PM    CALCIUM 8.4 10/17/2014 12:09 PM    BILIRUBIN, TOTAL 0.5 10/17/2014 12:09 PM    ALT 12 10/17/2014 12:09 PM    AST 11 10/17/2014 12:09 PM    ALK. PHOSPHATASE 60 10/17/2014 12:09 PM    PROTEIN, TOTAL 7.6 10/17/2014 12:09 PM    ALBUMIN 3.7 10/17/2014 12:09 PM    GLOBULIN 3.9 10/17/2014 12:09 PM    A-G RATIO 0.9 10/17/2014 12:09 PM      Coagulation:  No results found for: INR, PTMR, PTP, PT1, PT2, No results found for: APTT  Liver:  Lab Results  Component Value Date/Time   ALT 12 10/17/2014 12:09 PM   AST 11 10/17/2014 12:09 PM   ALK. PHOSPHATASE 60 10/17/2014 12:09 PM   BILIRUBIN, TOTAL 0.5 10/17/2014 12:09 PM     H.Pylori:  No results found for: HPYALT     Physical Exam   Constitutional: She is oriented to person, place, and time. Vital signs are normal. She appears well-developed and well-nourished. She is cooperative. No distress. She is not intubated.   Cardiovascular: Normal rate, regular rhythm, normal heart sounds and intact distal pulses.  Exam reveals no gallop and no friction rub.    No murmur heard.  Pulmonary/Chest: Effort normal and breath sounds normal. No accessory muscle usage. No apnea, no tachypnea and no bradypnea. She is not intubated. No respiratory distress. She has no decreased breath sounds. She has no wheezes. She has no rhonchi. She has no rales. Chest wall is not dull to percussion. She exhibits tenderness. She exhibits no mass, no bony tenderness, no laceration, no crepitus, no edema, no deformity, no swelling and no retraction.   Neurological: She is alert and oriented to person, place, and time. Coordination normal.   Skin: She is not diaphoretic.   Nursing note and vitals reviewed.    ASSESSMENT and PLAN  Kayla Huff was seen today for follow-up.    Diagnoses and all orders for this visit:    Essential hypertension  D/C lisinipril.  Screening labs were negative.  Cholesterol is good.  No underlying causes of HTN noted.  Will obtain renal US to r/o renal artery stenosis.  Will switch to HCTZ for now.  Follow up in office in two weeks.  Bring home BP log with you to visit.  Orders:  -     hydrochlorothiazide (HYDRODIURIL) 25 mg tablet; Take 1 Tab by mouth daily.  -     [place] RETROPERITONEUM LTD; Future    Costochondral pain  OTC management reviewed.  Instructed to rest, heat/ice as needed.  Will follow up at two week visit.  Orders:  -     acetaminophen (TYLENOL) 325 mg tablet; Take 1 Tab by mouth every six (6) hours as needed for Pain.    Plan of care reviewed with patient.  Understanding verbalized and they are in agreement with plan of care.

## 2014-10-31 ENCOUNTER — Encounter: Attending: Nurse Practitioner

## 2014-10-31 NOTE — Progress Notes (Signed)
Patient seen in office 10/30/2014.  Issues addressed at visit.

## 2014-11-07 ENCOUNTER — Inpatient Hospital Stay: Payer: PRIVATE HEALTH INSURANCE | Attending: Nurse Practitioner

## 2014-11-07 NOTE — Addendum Note (Signed)
Addended by: Artelia LarocheENOS, Janyth Riera A on: 11/07/2014 09:25 AM      Modules accepted: Orders

## 2014-11-15 ENCOUNTER — Encounter: Attending: Nurse Practitioner

## 2014-11-27 ENCOUNTER — Ambulatory Visit (INDEPENDENT_AMBULATORY_CARE_PROVIDER_SITE_OTHER): Payer: 59 | Admitting: Family Medicine

## 2014-11-27 ENCOUNTER — Telehealth: Payer: Self-pay

## 2014-11-27 VITALS — BP 148/100 | HR 76 | Temp 98.2°F | Resp 16 | Ht 62.0 in | Wt 124.8 lb

## 2014-11-27 DIAGNOSIS — I10 Essential (primary) hypertension: Secondary | ICD-10-CM

## 2014-11-27 DIAGNOSIS — Z131 Encounter for screening for diabetes mellitus: Secondary | ICD-10-CM

## 2014-11-27 DIAGNOSIS — R519 Headache, unspecified: Secondary | ICD-10-CM

## 2014-11-27 DIAGNOSIS — R51 Headache: Secondary | ICD-10-CM

## 2014-11-27 DIAGNOSIS — R202 Paresthesia of skin: Secondary | ICD-10-CM

## 2014-11-27 LAB — COMPLETE METABOLIC PANEL WITH GFR
AST: 13 U/L (ref 0–37)
Albumin: 4.1 g/dL (ref 3.5–5.2)
Alkaline Phosphatase: 45 U/L (ref 39–117)
BUN: 8 mg/dL (ref 6–23)
CO2: 24 mEq/L (ref 19–32)
Chloride: 105 mEq/L (ref 96–112)
Creat: 0.71 mg/dL (ref 0.50–1.10)
GFR, Est African American: >89 mL/min
GFR, Est Non African American: >89 mL/min
Glucose, Bld: 87 mg/dL (ref 70–99)
Potassium: 3.9 mEq/L (ref 3.5–5.3)
Total Protein: 6.8 g/dL (ref 6.0–8.3)

## 2014-11-27 LAB — COMPLETE METABOLIC PANEL WITHOUT GFR
ALT: 9 U/L (ref 0–35)
Calcium: 9.3 mg/dL (ref 8.4–10.5)
Sodium: 138 meq/L (ref 135–145)
Total Bilirubin: 0.7 mg/dL (ref 0.2–1.2)

## 2014-11-27 LAB — POCT GLYCOSYLATED HEMOGLOBIN (HGB A1C): Hemoglobin A1C: 4.6

## 2014-11-27 MED ORDER — BUTALBITAL-APAP-CAFFEINE 50-325-40 MG PO TABS: 1.0000 | ORAL_TABLET | Freq: Four times a day (QID) | ORAL | Status: AC | PRN

## 2014-11-27 MED ORDER — LISINOPRIL 20 MG PO TABS: 20.0000 mg | ORAL_TABLET | Freq: Every day | ORAL | Status: DC

## 2014-11-27 NOTE — Progress Notes (Signed)
Chief Complaint:  Chief Complaint  Patient presents with  . Hypertension    x 1 year    HPI: Krista Rodgers is a 24 y.o. female who is here for  HTN which she has had since she was in [employer] She was on HCTZ 25 in August 2015, Then BP was still not well controlled and she was put on Lisinopril 10 mg daily, has been on it for Dec 2015.  BPs at home 133/100 She drinks minimal caffeine, 1 soda a week, no coffee She has had elevated blood pressure since High school. But did not do much about it.  Strong family hx of HTN , dad was 2619 when he started on BP meds. Her cholesterol was high, she was scheduled to have renal US  [place] Non smoker No DM or thyroid disease Last blood work done was in January 2016, No hx of diabetes.  She also has had right foot numbness intermittently since she was started on both lisinopril and HCTZ. Denies any hip, knee, leg problems.   PCP: Wal[place] Reining[place]ridgette [place]   Past Medical History  Diagnosis Date  . Hypertension   . Hyperlipidemia    History reviewed. No pertinent past surgical history. History   Social History  . Marital Status: Single    Spouse Name: N/A  . Number of Children: N/A  . Years of Education: N/A   Social History Main Topics  . Smoking status: Never Smoker   . Smokeless tobacco: Not on file  . Alcohol Use: No  . Drug Use: No  . Sexual Activity: Yes    Birth Control/ Protection: Implant   Other Topics Concern  . None   Social History Narrative   History reviewed. No pertinent family history. Allergies  Allergen Reactions  . Amoxicillin Other (See Comments)    Childhood reaction  . Peach [Prunus Persica] Hives   Prior to Admission medications   Medication Sig Start Date End Date Taking? Authorizing Provider  hydrochlorothiazide (HYDRODIURIL) 25 MG tablet Take 25 mg by mouth daily.   Yes Historical Provider, MD  lisinopril (PRINIVIL,ZESTRIL) 10 MG tablet Take 10 mg by mouth daily.   Yes  Historical Provider, MD  medroxyPROGESTERone (DEPO-PROVERA) 150 MG/ML injection Inject 150 mg into the muscle every 3 (three) months.   Yes Historical Provider, MD  ibuprofen (ADVIL,MOTRIN) 200 MG tablet Take 600 mg by mouth daily as needed (pain).    Historical Provider, MD     ROS: The patient denies fevers, chills, night sweats, unintentional weight loss, chest pain, palpitations, wheezing, dyspnea on exertion, nausea, vomiting, abdominal pain, dysuria, hematuria, melena, weakness + tingling  All other systems have been reviewed and were otherwise negative with the exception of those mentioned in the HPI and as above.    PHYSICAL EXAM: Filed Vitals:   11/27/14 1437  BP: 148/100  Pulse: 76  Temp: 98.2 F (36.8 C)  Resp: 16   Filed Vitals:   11/27/14 1437  Height: 5\' 2"  (1.575 m)  Weight: 124 lb 12.8 oz (56.609 kg)   Body mass index is 22.82 kg/(m^2).  General: Alert, no acute distress HEENT:  Normocephalic, atraumatic, oropharynx patent. EOMI, PERRLA, fundo exam normal Cardiovascular:  Regular rate and rhythm, no rubs murmurs or gallops.  No Carotid bruits, radial pulse intact. No pedal edema.  Respiratory: Clear to auscultation bilaterally.  No wheezes, rales, or rhonchi.  No cyanosis, no use of accessory musculature GI:  No organomegaly, abdomen is soft and non-tender, positive bowel sounds.  No masses. Skin: No rashes. Neurologic: Facial musculature symmetric. Psychiatric: Patient is appropriate throughout our interaction. Lymphatic: No cervical lymphadenopathy Musculoskeletal: Gait intact. UE and Tremon Sainvil 2/2 DTRS, 5/5 strength   LABS: Results for orders placed or performed in visit on 11/27/14  POCT glycosylated hemoglobin (Hb A1C)  Result Value Ref Range   Hemoglobin A1C 4.6      EKG/XRAY:   Primary read interpreted by Dr. Conley Rolls at [facility].   ASSESSMENT/PLAN: Encounter Diagnoses  Name Primary?  . Essential hypertension Yes  . Nonintractable headache, unspecified  chronicity pattern, unspecified headache type   . Screening for diabetes mellitus (DM)     24 year old female with a PMH of HTN , Hyperlipidemia who is here for diffuse HA which she is not sure if it is related to her BP or if she has a hx of migraines. She states her BP is always shigh and not necessarily related to her HA but she takes her BP when she HA and it is always high.  Increase lisinopril to 20 mg daily, monitor SEs Cont with HCTZ Renal US ordered Will do a trial of fioricet after doing a trial of Lisinopril 20 mg daily, if HA sxs improve with better HTN control then realted to HTn but if not then most likely she has migraine sxs CMP pending Note for work given Precautions given Request for records Fu in 1 month  Gross sideeffects, risk and benefits, and alternatives of medications d/w patient. Patient is aware that all medications have potential sideeffects and we are unable to predict every sideeffect or drug-drug interaction that may occur.  Hamilton Capri PHUONG, DO 11/27/2014 3:48 PM

## 2014-11-27 NOTE — Patient Instructions (Signed)
Headaches, Frequently Asked Questions °MIGRAINE HEADACHES °Q: What is migraine? What causes it? How can I treat it? °A: Generally, migraine headaches begin as a dull ache. Then they develop into a constant, throbbing, and pulsating pain. You may experience pain at the temples. You may experience pain at the front or back of one or both sides of the head. The pain is usually accompanied by a combination of: °· Nausea. °· Vomiting. °· Sensitivity to light and noise. °Some people (about 15%) experience an aura (see below) before an attack. The cause of migraine is believed to be chemical reactions in the brain. Treatment for migraine may include over-the-counter or prescription medications. It may also include self-help techniques. These include relaxation training and biofeedback.  °Q: What is an aura? °A: About 15% of people with migraine get an "aura". This is a sign of neurological symptoms that occur before a migraine headache. You may see wavy or jagged lines, dots, or flashing lights. You might experience tunnel vision or blind spots in one or both eyes. The aura can include visual or auditory hallucinations (something imagined). It may include disruptions in smell (such as strange odors), taste or touch. Other symptoms include: °· Numbness. °· A "pins and needles" sensation. °· Difficulty in recalling or speaking the correct word. °These neurological events may last as long as 60 minutes. These symptoms will fade as the headache begins. °Q: What is a trigger? °A: Certain physical or environmental factors can lead to or "trigger" a migraine. These include: °· Foods. °· Hormonal changes. °· Weather. °· Stress. °It is important to remember that triggers are different for everyone. To help prevent migraine attacks, you need to figure out which triggers affect you. Keep a headache diary. This is a good way to track triggers. The diary will help you talk to your healthcare professional about your condition. °Q: Does  weather affect migraines? °A: Bright sunshine, hot, humid conditions, and drastic changes in barometric pressure may lead to, or "trigger," a migraine attack in some people. But studies have shown that weather does not act as a trigger for everyone with migraines. °Q: What is the link between migraine and hormones? °A: Hormones start and regulate many of your body's functions. Hormones keep your body in balance within a constantly changing environment. The levels of hormones in your body are unbalanced at times. Examples are during menstruation, pregnancy, or menopause. That can lead to a migraine attack. In fact, about three quarters of all women with migraine report that their attacks are related to the menstrual cycle.  °Q: Is there an increased risk of stroke for migraine sufferers? °A: The likelihood of a migraine attack causing a stroke is very remote. That is not to say that migraine sufferers cannot have a stroke associated with their migraines. In persons under age 40, the most common associated factor for stroke is migraine headache. But over the course of a person's normal life span, the occurrence of migraine headache may actually be associated with a reduced risk of dying from cerebrovascular disease due to stroke.  °Q: What are acute medications for migraine? °A: Acute medications are used to treat the pain of the headache after it has started. Examples over-the-counter medications, NSAIDs, ergots, and triptans.  °Q: What are the triptans? °A: Triptans are the newest class of abortive medications. They are specifically targeted to treat migraine. Triptans are vasoconstrictors. They moderate some chemical reactions in the brain. The triptans work on receptors in your brain. Triptans help   to restore the balance of a neurotransmitter called serotonin. Fluctuations in levels of serotonin are thought to be a main cause of migraine.  °Q: Are over-the-counter medications for migraine effective? °A:  Over-the-counter, or "OTC," medications may be effective in relieving mild to moderate pain and associated symptoms of migraine. But you should see your caregiver before beginning any treatment regimen for migraine.  °Q: What are preventive medications for migraine? °A: Preventive medications for migraine are sometimes referred to as "prophylactic" treatments. They are used to reduce the frequency, severity, and length of migraine attacks. Examples of preventive medications include antiepileptic medications, antidepressants, beta-blockers, calcium channel blockers, and NSAIDs (nonsteroidal anti-inflammatory drugs). °Q: Why are anticonvulsants used to treat migraine? °A: During the past few years, there has been an increased interest in antiepileptic drugs for the prevention of migraine. They are sometimes referred to as "anticonvulsants". Both epilepsy and migraine may be caused by similar reactions in the brain.  °Q: Why are antidepressants used to treat migraine? °A: Antidepressants are typically used to treat people with depression. They may reduce migraine frequency by regulating chemical levels, such as serotonin, in the brain.  °Q: What alternative therapies are used to treat migraine? °A: The term "alternative therapies" is often used to describe treatments considered outside the scope of conventional Western medicine. Examples of alternative therapy include acupuncture, acupressure, and yoga. Another common alternative treatment is herbal therapy. Some herbs are believed to relieve headache pain. Always discuss alternative therapies with your caregiver before proceeding. Some herbal products contain arsenic and other toxins. °TENSION HEADACHES °Q: What is a tension-type headache? What causes it? How can I treat it? °A: Tension-type headaches occur randomly. They are often the result of temporary stress, anxiety, fatigue, or anger. Symptoms include soreness in your temples, a tightening band-like sensation  around your head (a "vice-like" ache). Symptoms can also include a pulling feeling, pressure sensations, and contracting head and neck muscles. The headache begins in your forehead, temples, or the back of your head and neck. Treatment for tension-type headache may include over-the-counter or prescription medications. Treatment may also include self-help techniques such as relaxation training and biofeedback. °CLUSTER HEADACHES °Q: What is a cluster headache? What causes it? How can I treat it? °A: Cluster headache gets its name because the attacks come in groups. The pain arrives with little, if any, warning. It is usually on one side of the head. A tearing or bloodshot eye and a runny nose on the same side of the headache may also accompany the pain. Cluster headaches are believed to be caused by chemical reactions in the brain. They have been described as the most severe and intense of any headache type. Treatment for cluster headache includes prescription medication and oxygen. °SINUS HEADACHES °Q: What is a sinus headache? What causes it? How can I treat it? °A: When a cavity in the bones of the face and skull (a sinus) becomes inflamed, the inflammation will cause localized pain. This condition is usually the result of an allergic reaction, a tumor, or an infection. If your headache is caused by a sinus blockage, such as an infection, you will probably have a fever. An x-ray will confirm a sinus blockage. Your caregiver's treatment might include antibiotics for the infection, as well as antihistamines or decongestants.  °REBOUND HEADACHES °Q: What is a rebound headache? What causes it? How can I treat it? °A: A pattern of taking acute headache medications too often can lead to a condition known as "rebound headache."   A pattern of taking too much headache medication includes taking it more than 2 days per week or in excessive amounts. That means more than the label or a caregiver advises. With rebound  headaches, your medications not only stop relieving pain, they actually begin to cause headaches. Doctors treat rebound headache by tapering the medication that is being overused. Sometimes your caregiver will gradually substitute a different type of treatment or medication. Stopping may be a challenge. Regularly overusing a medication increases the potential for serious side effects. Consult a caregiver if you regularly use headache medications more than 2 days per week or more than the label advises. °ADDITIONAL QUESTIONS AND ANSWERS °Q: What is biofeedback? °A: Biofeedback is a self-help treatment. Biofeedback uses special equipment to monitor your body's involuntary physical responses. Biofeedback monitors: °· Breathing. °· Pulse. °· Heart rate. °· Temperature. °· Muscle tension. °· Brain activity. °Biofeedback helps you refine and perfect your relaxation exercises. You learn to control the physical responses that are related to stress. Once the technique has been mastered, you do not need the equipment any more. °Q: Are headaches hereditary? °A: Four out of five (80%) of people that suffer report a family history of migraine. Scientists are not sure if this is genetic or a family predisposition. Despite the uncertainty, a child has a 50% chance of having migraine if one parent suffers. The child has a 75% chance if both parents suffer.  °Q: Can children get headaches? °A: By the time they reach high school, most young people have experienced some type of headache. Many safe and effective approaches or medications can prevent a headache from occurring or stop it after it has begun.  °Q: What type of doctor should I see to diagnose and treat my headache? °A: Start with your primary caregiver. Discuss his or her experience and approach to headaches. Discuss methods of classification, diagnosis, and treatment. Your caregiver may decide to recommend you to a headache specialist, depending upon your symptoms or other  physical conditions. Having diabetes, allergies, etc., may require a more comprehensive and inclusive approach to your headache. The [employer] will provide, upon request, a list of NHF physician members in your state. °Document Released: 12/13/2003 Document Revised: 12/15/2011 Document Reviewed: 05/22/2008 °ExitCare® Patient Information ©2015 [employer]. This information is not intended to replace advice given to you by your health care provider. Make sure you discuss any questions you have with your health care provider. ° °Hypertension °Hypertension, commonly called high blood pressure, is when the force of blood pumping through your arteries is too strong. Your arteries are the blood vessels that carry blood from your heart throughout your body. A blood pressure reading consists of a higher number over a lower number, such as 110/72. The higher number (systolic) is the pressure inside your arteries when your heart pumps. The lower number (diastolic) is the pressure inside your arteries when your heart relaxes. Ideally you want your blood pressure below 120/80. °Hypertension forces your heart to work harder to pump blood. Your arteries may become narrow or stiff. Having hypertension puts you at risk for heart disease, stroke, and other problems.  °RISK FACTORS °Some risk factors for high blood pressure are controllable. Others are not.  °Risk factors you cannot control include:  °· Race. You may be at higher risk if you are African American. °· Age. Risk increases with age. °· Gender. Men are at higher risk than women before age 45 years. After age 65, women are at   higher risk than men. °Risk factors you can control include: °· Not getting enough exercise or physical activity. °· Being overweight. °· Getting too much fat, sugar, calories, or salt in your diet. °· Drinking too much alcohol. °SIGNS AND SYMPTOMS °Hypertension does not usually cause signs or symptoms. Extremely high blood  pressure (hypertensive crisis) may cause headache, anxiety, shortness of breath, and nosebleed. °DIAGNOSIS  °To check if you have hypertension, your health care provider will measure your blood pressure while you are seated, with your arm held at the level of your heart. It should be measured at least twice using the same arm. Certain conditions can cause a difference in blood pressure between your right and left arms. A blood pressure reading that is higher than normal on one occasion does not mean that you need treatment. If one blood pressure reading is high, ask your health care provider about having it checked again. °TREATMENT  °Treating high blood pressure includes making lifestyle changes and possibly taking medicine. Living a healthy lifestyle can help lower high blood pressure. You may need to change some of your habits. °Lifestyle changes may include: °· Following the DASH diet. This diet is high in fruits, vegetables, and whole grains. It is low in salt, red meat, and added sugars. °· Getting at least 2½ hours of brisk physical activity every week. °· Losing weight if necessary. °· Not smoking. °· Limiting alcoholic beverages. °· Learning ways to reduce stress. ° If lifestyle changes are not enough to get your blood pressure under control, your health care provider may prescribe medicine. You may need to take more than one. Work closely with your health care provider to understand the risks and benefits. °HOME CARE INSTRUCTIONS °· Have your blood pressure rechecked as directed by your health care provider.   °· Take medicines only as directed by your health care provider. Follow the directions carefully. Blood pressure medicines must be taken as prescribed. The medicine does not work as well when you skip doses. Skipping doses also puts you at risk for problems.   °· Do not smoke.   °· Monitor your blood pressure at home as directed by your health care provider.  °SEEK MEDICAL CARE IF:  °· You think you  are having a reaction to medicines taken. °· You have recurrent headaches or feel dizzy. °· You have swelling in your ankles. °· You have trouble with your vision. °SEEK IMMEDIATE MEDICAL CARE IF: °· You develop a severe headache or confusion. °· You have unusual weakness, numbness, or feel faint. °· You have severe chest or abdominal pain. °· You vomit repeatedly. °· You have trouble breathing. °MAKE SURE YOU:  °· Understand these instructions. °· Will watch your condition. °· Will get help right away if you are not doing well or get worse. °Document Released: 09/22/2005 Document Revised: 02/06/2014 Document Reviewed: 07/15/2013 °ExitCare® Patient Information ©2015 [employer]. This information is not intended to replace advice given to you by your health care provider. Make sure you discuss any questions you have with your health care provider. ° °

## 2014-11-27 NOTE — Telephone Encounter (Signed)
Pharm called to ask for clarification of # for lisinopril (it had been sent in as #1 tab w/1RF). Nothing in notes specified that the pt had to RTC in 2 mos, so I gave quantity per protocol of #90 w/1RF.

## 2014-11-29 DIAGNOSIS — I10 Essential (primary) hypertension: Secondary | ICD-10-CM | POA: Insufficient documentation

## 2015-04-12 ENCOUNTER — Encounter: Payer: Self-pay | Admitting: Medical Oncology

## 2015-04-12 ENCOUNTER — Emergency Department
Admission: EM | Admit: 2015-04-12 | Discharge: 2015-04-12 | Disposition: A | Discharge status: Home / Self Care | Payer: 59 | Attending: Emergency Medicine | Admitting: Emergency Medicine

## 2015-04-12 DIAGNOSIS — Z3202 Encounter for pregnancy test, result negative: Secondary | ICD-10-CM | POA: Diagnosis not present

## 2015-04-12 DIAGNOSIS — Z880 Allergy status to penicillin: Secondary | ICD-10-CM | POA: Diagnosis not present

## 2015-04-12 DIAGNOSIS — N760 Acute vaginitis: Secondary | ICD-10-CM | POA: Diagnosis not present

## 2015-04-12 DIAGNOSIS — Z202 Contact with and (suspected) exposure to infections with a predominantly sexual mode of transmission: Secondary | ICD-10-CM | POA: Diagnosis present

## 2015-04-12 DIAGNOSIS — I10 Essential (primary) hypertension: Secondary | ICD-10-CM | POA: Diagnosis not present

## 2015-04-12 DIAGNOSIS — Z793 Long term (current) use of hormonal contraceptives: Secondary | ICD-10-CM | POA: Insufficient documentation

## 2015-04-12 DIAGNOSIS — N72 Inflammatory disease of cervix uteri: Secondary | ICD-10-CM | POA: Insufficient documentation

## 2015-04-12 DIAGNOSIS — Z79899 Other long term (current) drug therapy: Secondary | ICD-10-CM | POA: Diagnosis not present

## 2015-04-12 DIAGNOSIS — B9689 Other specified bacterial agents as the cause of diseases classified elsewhere: Secondary | ICD-10-CM

## 2015-04-12 LAB — WET PREP, GENITAL
Trich, Wet Prep: NONE SEEN
YEAST WET PREP: NONE SEEN

## 2015-04-12 LAB — CHLAMYDIA/NGC RT PCR (ARMC ONLY)
CHLAMYDIA TR: NOT DETECTED
N GONORRHOEAE: NOT DETECTED

## 2015-04-12 LAB — URINALYSIS COMPLETE WITH MICROSCOPIC (ARMC ONLY)
BILIRUBIN URINE: NEGATIVE
Bacteria, UA: NONE SEEN
Glucose, UA: NEGATIVE mg/dL
HGB URINE DIPSTICK: NEGATIVE
LEUKOCYTES UA: NEGATIVE
Nitrite: NEGATIVE
Protein, ur: NEGATIVE mg/dL
RBC / HPF: NONE SEEN RBC/hpf (ref 0–5)
Specific Gravity, Urine: 1.025 (ref 1.005–1.030)
pH: 6 (ref 5.0–8.0)

## 2015-04-12 LAB — POCT PREGNANCY, URINE: PREG TEST UR: NEGATIVE

## 2015-04-12 MED ORDER — METRONIDAZOLE 500 MG PO TABS: 500.0000 mg | ORAL_TABLET | Freq: Two times a day (BID) | ORAL | Status: AC

## 2015-04-12 MED ORDER — LIDOCAINE HCL (PF) 1 % IJ SOLN
2.1000 mL | Freq: Once | INTRAMUSCULAR | Status: AC
Start: 2015-04-12 — End: 2015-04-12
  Administered 2015-04-12: 2.1 mL
  Filled 2015-04-12: qty 5

## 2015-04-12 MED ORDER — METRONIDAZOLE 250 MG PO TABS
500.0000 mg | ORAL_TABLET | Freq: Once | ORAL | Status: AC
Start: 2015-04-12 — End: 2015-04-12
  Administered 2015-04-12: 500 mg via ORAL
  Filled 2015-04-12: qty 2

## 2015-04-12 MED ORDER — AZITHROMYCIN 250 MG PO TABS
1000.0000 mg | ORAL_TABLET | Freq: Once | ORAL | Status: AC
  Administered 2015-04-12: 1000 mg via ORAL
  Filled 2015-04-12: qty 4

## 2015-04-12 MED ORDER — CEFTRIAXONE SODIUM 1 G IJ SOLR
500.0000 mg | Freq: Once | INTRAMUSCULAR | Status: AC
  Administered 2015-04-12: 500 mg via INTRAMUSCULAR
  Filled 2015-04-12: qty 10

## 2015-04-12 NOTE — ED Provider Notes (Signed)
[facility] Emergency Department Provider Note ___________________________________________  Time seen: Approximately 5:08 PM  I have reviewed the triage vital signs and the nursing notes.   HISTORY  Chief Complaint Exposure to STD   HPI Krista Rodgers is a 24 y.o. female who presents to the emergency department to be tested for chlamydia. Her boyfriend tested positive yesterday.She denies symptoms.   Past Medical History  Diagnosis Date  . Hypertension   . Hyperlipidemia     Patient Active Problem List   Diagnosis Date Noted  . Essential hypertension 11/29/2014    History reviewed. No pertinent past surgical history.  Current Outpatient Rx  Name  Route  Sig  Dispense  Refill  . butalbital-acetaminophen-caffeine (FIORICET) 50-325-40 MG per tablet   Oral   Take 1-2 tablets by mouth every 6 (six) hours as needed for headache.   20 tablet   0   . hydrochlorothiazide (HYDRODIURIL) 25 MG tablet   Oral   Take 25 mg by mouth daily.         Marland Kitchen ibuprofen (ADVIL,MOTRIN) 200 MG tablet   Oral   Take 600 mg by mouth daily as needed (pain).         Marland Kitchen lisinopril (PRINIVIL,ZESTRIL) 20 MG tablet   Oral   Take 1 tablet (20 mg total) by mouth daily.   1 tablet   1   . medroxyPROGESTERone (DEPO-PROVERA) 150 MG/ML injection   Intramuscular   Inject 150 mg into the muscle every 3 (three) months.         . metroNIDAZOLE (FLAGYL) 500 MG tablet   Oral   Take 1 tablet (500 mg total) by mouth 2 (two) times daily.   20 tablet   0     Allergies Amoxicillin and Peach  No family history on file.  Social History History  Substance Use Topics  . Smoking status: Never Smoker   . Smokeless tobacco: Not on file  . Alcohol Use: No    Review of Systems Constitutional: No fever/chills Cardiovascular: Denies chest pain. Respiratory: Denies shortness of breath or cough. Gastrointestinal: Abdominal pain no., nausea no, vomitingno. Genitourinary:  Dysuria no, vaginal discharge no.. Musculoskeletal: Negative for back pain. Skin: Negative for rash. Neurological: Negative for headaches, focal weakness or numbness.  10-point ROS otherwise negative.  ____________________________________________   PHYSICAL EXAM:  VITAL SIGNS: ED Triage Vitals  Enc Vitals Group     BP 04/12/15 1634 152/109 mmHg     Pulse Rate 04/12/15 1634 89     Resp 04/12/15 1634 18     Temp 04/12/15 1634 98.6 F (37 C)     Temp Source 04/12/15 1634 Oral     SpO2 04/12/15 1634 99 %     Weight 04/12/15 1634 127 lb (57.607 kg)     Height 04/12/15 1634  (1.549 m)     Head Cir --      Peak Flow --      Pain Score --      Pain Loc --      Pain Edu? --      Excl. in GC? --     Constitutional: Alert and oriented. Well appearing and in no acute distress. Eyes: Conjunctivae are normal. PERRL. EOMI. Head: Atraumatic. Nose: No congestion/rhinnorhea. Mouth/Throat: Mucous membranes are moist.  Oropharynx non-erythematous. Neck: No stridor. Cardiovascular: Good peripheral circulation. Respiratory: Normal respiratory effort.  No retractions. Gastrointestinal: Soft and nontender. No distention. No abdominal bruits. Genitourinary: Pelvic exam: Normal exterior exam; thin white  malodorous discharge surrounding cervix and in vaginal vault; Cervix with mild motion tenderness; Thick exudate present at cervical os. Musculoskeletal: No extremity tenderness nor edema.  Neurologic:  Normal speech and language. No gross focal neurologic deficits are appreciated. Speech is normal. No gait instability. Skin:  Skin is warm, dry and intact. No rash noted. Psychiatric: Mood and affect are normal. Speech and behavior are normal.  ____________________________________________   LABS (all labs ordered are listed, but only abnormal results are displayed)  Labs Reviewed  WET PREP, GENITAL - Abnormal; Notable for the following:    Clue Cells Wet Prep HPF POC FEW (*)    WBC,  Wet Prep HPF POC FEW (*)    All other components within normal limits  URINALYSIS COMPLETEWITH MICROSCOPIC ([facility] ONLY) - Abnormal; Notable for the following:    Color, Urine YELLOW (*)    APPearance CLEAR (*)    Ketones, ur TRACE (*)    Squamous Epithelial / LPF 0-5 (*)    All other components within normal limits  CHLAMYDIA/NGC RT PCR ([facility] ONLY)  POC URINE PREG, ED   ____________________________________________  RADIOLOGY  Not indicated ____________________________________________   PROCEDURES  Procedure(s) performed: Pelvic exam-see assessment  ____________________________________________   INITIAL IMPRESSION / ASSESSMENT AND PLAN / ED COURSE  Pertinent labs & imaging results that were available during my care of the patient were reviewed by me and considered in my medical decision making (see chart for details).   Patient was advised to follow up with the gynecologist of her choice or [place] STD clinic if not improving over the next week. She will be given IM Rocephin and PO azithromycin based on exposure and exam findings prior to results of GC/Chlamydia results in approximately 3 hours. ____________________________________________   FINAL CLINICAL IMPRESSION(S) / ED DIAGNOSES  Final diagnoses:  Cervicitis  Bacterial vaginosis       Chinita PesterCari B Thurma Priego, FNP 04/12/15 1844  Maurilio LovelyNoelle McLaurin, MD 04/13/15 [id number]

## 2015-04-12 NOTE — ED Notes (Addendum)
Pt reports her boyfriend found out yesterday that he has chlymydia. Pt wants to be treated.pt denies sx's.

## 2015-05-03 ENCOUNTER — Emergency Department: Payer: 59

## 2015-05-03 ENCOUNTER — Emergency Department
Admission: EM | Admit: 2015-05-03 | Discharge: 2015-05-03 | Disposition: A | Discharge status: Home / Self Care | Payer: 59 | Attending: Emergency Medicine | Admitting: Emergency Medicine

## 2015-05-03 DIAGNOSIS — O10012 Pre-existing essential hypertension complicating pregnancy, second trimester: Secondary | ICD-10-CM | POA: Diagnosis not present

## 2015-05-03 DIAGNOSIS — O209 Hemorrhage in early pregnancy, unspecified: Secondary | ICD-10-CM | POA: Diagnosis present

## 2015-05-03 DIAGNOSIS — Z79899 Other long term (current) drug therapy: Secondary | ICD-10-CM | POA: Diagnosis not present

## 2015-05-03 DIAGNOSIS — Z3A22 22 weeks gestation of pregnancy: Secondary | ICD-10-CM | POA: Insufficient documentation

## 2015-05-03 DIAGNOSIS — Z880 Allergy status to penicillin: Secondary | ICD-10-CM | POA: Insufficient documentation

## 2015-05-03 DIAGNOSIS — O200 Threatened abortion: Secondary | ICD-10-CM | POA: Insufficient documentation

## 2015-05-03 LAB — CBC WITH DIFFERENTIAL/PLATELET
BASOS ABS: 0.3 10*3/uL — AB (ref 0–0.1)
Basophils Relative: 4 %
EOS PCT: 1 %
Eosinophils Absolute: 0.1 10*3/uL (ref 0–0.7)
HEMATOCRIT: 36.9 % (ref 35.0–47.0)
Hemoglobin: 11.9 g/dL — ABNORMAL LOW (ref 12.0–16.0)
LYMPHS PCT: 14 %
Lymphs Abs: 1.2 10*3/uL (ref 1.0–3.6)
MCH: 26.4 pg (ref 26.0–34.0)
MCHC: 32.1 g/dL (ref 32.0–36.0)
MCV: 82.2 fL (ref 80.0–100.0)
Monocytes Absolute: 0.5 10*3/uL (ref 0.2–0.9)
Monocytes Relative: 6 %
NEUTROS PCT: 75 %
Neutro Abs: 6.3 10*3/uL (ref 1.4–6.5)
PLATELETS: 209 10*3/uL (ref 150–440)
RBC: 4.5 MIL/uL (ref 3.80–5.20)
RDW: 13.7 % (ref 11.5–14.5)
WBC: 8.4 10*3/uL (ref 3.6–11.0)

## 2015-05-03 LAB — URINALYSIS COMPLETE WITH MICROSCOPIC (ARMC ONLY)
BACTERIA UA: NONE SEEN
BILIRUBIN URINE: NEGATIVE
Glucose, UA: NEGATIVE mg/dL
Hgb urine dipstick: NEGATIVE
Ketones, ur: NEGATIVE mg/dL
Leukocytes, UA: NEGATIVE
Nitrite: NEGATIVE
PROTEIN: NEGATIVE mg/dL
Specific Gravity, Urine: 1.016 (ref 1.005–1.030)
pH: 7 (ref 5.0–8.0)

## 2015-05-03 LAB — HCG, QUANTITATIVE, PREGNANCY: HCG, BETA CHAIN, QUANT, S: 82824 m[IU]/mL — AB (ref <5)

## 2015-05-03 NOTE — ED Notes (Signed)
Pt states she had positive home pregnancy test earlier in the week, vaginal bleeding light at this time. Pt reports passing 1 clot. RLQ abdominal pain. Pt alert and oriented X4, active, cooperative, pt in NAD. RR even and unlabored, color WNL.

## 2015-05-03 NOTE — Discharge Instructions (Signed)
Return to the emergency department for any worsening condition including vaginal bleeding heavier than a normal period, new or worsening bowel pain, dizziness or passing out, or any other symptoms concerning to you.   Threatened Miscarriage A threatened miscarriage is when you have vaginal bleeding during your first 20 weeks of pregnancy but the pregnancy has not ended. Your doctor will do tests to make sure you are still pregnant. The cause of the bleeding may not be known. This condition does not mean your pregnancy will end. It does increase the risk of it ending (complete miscarriage). HOME CARE   Make sure you keep all your doctor visits for prenatal care.  Get plenty of rest.  Do not have sex or use tampons if you have vaginal bleeding.  Do not douche.  Do not smoke or use drugs.  Do not drink alcohol.  Avoid caffeine. GET HELP IF:  You have light bleeding from your vagina.  You have belly pain or cramping.  You have a fever. GET HELP RIGHT AWAY IF:   You have heavy bleeding from your vagina.  You have clots of blood coming from your vagina.  You have bad pain or cramps in your low back or belly.  You have fever, chills, and bad belly pain. MAKE SURE YOU:   Understand these instructions.  Will watch your condition.  Will get help right away if you are not doing well or get worse. Document Released: 09/04/2008 Document Revised: 09/27/2013 Document Reviewed: 07/19/2013 [facility] Patient Information 2015 [place], [place]. This information is not intended to replace advice given to you by your health care provider. Make sure you discuss any questions you have with your health care provider.  Vaginal Bleeding During Pregnancy, First Trimester A small amount of bleeding (spotting) from the vagina is common in early pregnancy. Sometimes the bleeding is normal and is not a problem, and sometimes it is a sign of something serious. Be sure to tell your doctor about any  bleeding from your vagina right away. HOME CARE  Watch your condition for any changes.  Follow your doctor's instructions about how active you can be.  If you are on bed rest:  You may need to stay in bed and only get up to use the bathroom.  You may be allowed to do some activities.  If you need help, make plans for someone to help you.  Write down:  The number of pads you use each day.  How often you change pads.  How soaked (saturated) your pads are.  Do not use tampons.  Do not douche.  Do not have sex or orgasms until your doctor says it is okay.  If you pass any tissue from your vagina, save the tissue so you can show it to your doctor.  Only take medicines as told by your doctor.  Do not take aspirin because it can make you bleed.  Keep all follow-up visits as told by your doctor. GET HELP IF:   You bleed from your vagina.  You have cramps.  You have labor pains.  You have a fever that does not go away after you take medicine. GET HELP RIGHT AWAY IF:   You have very bad cramps in your back or belly (abdomen).  You pass large clots or tissue from your vagina.  You bleed more.  You feel light-headed or weak.  You pass out (faint).  You have chills.  You are leaking fluid or have a gush of fluid  from your vagina.  You pass out while pooping (having a bowel movement). MAKE SURE YOU:  Understand these instructions.  Will watch your condition.  Will get help right away if you are not doing well or get worse. Document Released: 02/06/2014 Document Reviewed: 05/30/2013 [facility] Patient Information 2015 [place], [place]. This information is not intended to replace advice given to you by your health care provider. Make sure you discuss any questions you have with your health care provider.

## 2015-05-03 NOTE — ED Provider Notes (Signed)
[facility] Emergency Department Provider Note   ____________________________________________  Time seen: 6:15 PM I have reviewed the triage vital signs and the triage nursing note.  HISTORY  Chief Complaint Vaginal Bleeding   Historian Patient  HPI Krista Rodgers is a 24 y.o. female who is here for vaginal bleeding in early pregnancy. Her pregnancy test last week and is about [redacted] weeks pregnant. She has not had an ultrasound yet. She has had a couple of vaginal bleeding spotting/clots over the past 2-3 days. She has had one pregnancy previously which included a miscarriage around 5 weeks. She's had a lot of cramping this morning. No vomiting or passing out.    Past Medical History  Diagnosis Date  . Hypertension   . Hyperlipidemia     Patient Active Problem List   Diagnosis Date Noted  . Essential hypertension 11/29/2014    History reviewed. No pertinent past surgical history.  Current Outpatient Rx  Name  Route  Sig  Dispense  Refill  . butalbital-acetaminophen-caffeine (FIORICET) 50-325-40 MG per tablet   Oral   Take 1-2 tablets by mouth every 6 (six) hours as needed for headache.   20 tablet   0   . hydrochlorothiazide (HYDRODIURIL) 25 MG tablet   Oral   Take 25 mg by mouth daily.         Marland Kitchen ibuprofen (ADVIL,MOTRIN) 200 MG tablet   Oral   Take 600 mg by mouth daily as needed (pain).         Marland Kitchen lisinopril (PRINIVIL,ZESTRIL) 20 MG tablet   Oral   Take 1 tablet (20 mg total) by mouth daily.   1 tablet   1   . medroxyPROGESTERone (DEPO-PROVERA) 150 MG/ML injection   Intramuscular   Inject 150 mg into the muscle every 3 (three) months.           Allergies Amoxicillin and Peach  No family history on file.  Social History History  Substance Use Topics  . Smoking status: Never Smoker   . Smokeless tobacco: Not on file  . Alcohol Use: No    Review of Systems  Constitutional: Negative for fever. Eyes: Negative for  visual changes. ENT: Negative for sore throat. Cardiovascular: Negative for chest pain. Respiratory: Negative for shortness of breath. Gastrointestinal: Negative for abdominal pain, vomiting and diarrhea. Genitourinary: Negative for dysuria. Musculoskeletal: Negative for back pain. Skin: Negative for rash. Neurological: Negative for headaches, focal weakness or numbness. 10 point Review of Systems otherwise negative ____________________________________________   PHYSICAL EXAM:  VITAL SIGNS: ED Triage Vitals  Enc Vitals Group     BP 05/03/15 1521 128/78 mmHg     Pulse Rate 05/03/15 1521 89     Resp 05/03/15 1521 18     Temp 05/03/15 1521 97.9 F (36.6 C)     Temp Source 05/03/15 1521 Oral     SpO2 05/03/15 1521 99 %     Weight 05/03/15 1521 130 lb (58.968 kg)     Height 05/03/15 1521  (1.549 m)     Head Cir --      Peak Flow --      Pain Score 05/03/15 1527 5     Pain Loc --      Pain Edu? --      Excl. in GC? --      Constitutional: Alert and oriented. Well appearing and in no distress. Eyes: Conjunctivae are normal. PERRL. Normal extraocular movements. ENT   Head: Normocephalic and atraumatic.  Nose: No congestion/rhinnorhea.   Mouth/Throat: Mucous membranes are moist.   Neck: No stridor. Cardiovascular/Chest: Normal rate, regular rhythm.  No murmurs, rubs, or gallops. Respiratory: Normal respiratory effort without tachypnea nor retractions. Breath sounds are clear and equal bilaterally. No wheezes/rales/rhonchi. Gastrointestinal: Soft. No distention, no guarding, no rebound.  Mild tenderness suprapubically.  Genitourinary/rectal:Deferred Musculoskeletal: Nontender with normal range of motion in all extremities. No joint effusions.  No lower extremity tenderness nor edema. Neurologic:  Normal speech and language. No gross or focal neurologic deficits are appreciated. Skin:  Skin is warm, dry and intact. No rash noted. Psychiatric: Mood and affect  are normal. Speech and behavior are normal. Patient exhibits appropriate insight and judgment.  ____________________________________________   EKG I, Governor Rooks, MD, the attending physician have personally viewed and interpreted all ECGs.  No EKG performed ____________________________________________  LABS (pertinent positives/negatives)  HCG [id number] Blood type a positive CBC without significant abnormality. Hemoglobin 11.9  ____________________________________________  RADIOLOGY All Xrays were viewed by me. Imaging interpreted by Radiologist.  Ultrasound OB:  IMPRESSION: Single intrauterine pregnancy of approximately 6 weeks 3 days gestation. Small subchorionic hemorrhage. __________________________________________  PROCEDURES  Procedure(s) performed: None Critical Care performed: None  ____________________________________________   ED COURSE / ASSESSMENT AND PLAN  CONSULTATIONS: None  Pertinent labs & imaging results that were available during my care of the patient were reviewed by me and considered in my medical decision making (see chart for details).   Patient is overall well-appearing and stable vital signs. Patient has recently been checked for STDs and is not complaining of any vaginal discharge. We've deferred pelvic exam tonight. Ultrasound shows IUP which is viable at this point time. I discussed with the patient the finding of subchorionic hemorrhage and return precautions.  Patient / Family / Caregiver informed of clinical course, medical decision-making process, and agree with plan.   I discussed return precautions, follow-up instructions, and discharged instructions with patient and/or family.  ___________________________________________   FINAL CLINICAL IMPRESSION(S) / ED DIAGNOSES   Final diagnoses:  Vaginal bleeding before [redacted] weeks gestation  Threatened miscarriage    FOLLOW UP  Referred to: OB/GYN follow-up in one week.   Governor Rooks, MD 05/03/15 2009

## 2015-05-04 LAB — ABO/RH: ABO/RH(D): A POS

## 2015-05-05 LAB — ABO/RH: ABO/RH(D): A POS

## 2015-11-14 ENCOUNTER — Emergency Department (HOSPITAL_COMMUNITY)
Admission: EM | Admit: 2015-11-14 | Discharge: 2015-11-14 | Disposition: A | Discharge status: Home / Self Care | Payer: 59 | Attending: Emergency Medicine | Admitting: Emergency Medicine

## 2015-11-14 ENCOUNTER — Encounter (HOSPITAL_COMMUNITY): Payer: Self-pay

## 2015-11-14 DIAGNOSIS — Z8639 Personal history of other endocrine, nutritional and metabolic disease: Secondary | ICD-10-CM | POA: Insufficient documentation

## 2015-11-14 DIAGNOSIS — Z880 Allergy status to penicillin: Secondary | ICD-10-CM | POA: Insufficient documentation

## 2015-11-14 DIAGNOSIS — Z3490 Encounter for supervision of normal pregnancy, unspecified, unspecified trimester: Secondary | ICD-10-CM

## 2015-11-14 DIAGNOSIS — I10 Essential (primary) hypertension: Secondary | ICD-10-CM | POA: Insufficient documentation

## 2015-11-14 DIAGNOSIS — R1310 Dysphagia, unspecified: Secondary | ICD-10-CM | POA: Diagnosis not present

## 2015-11-14 DIAGNOSIS — Z202 Contact with and (suspected) exposure to infections with a predominantly sexual mode of transmission: Secondary | ICD-10-CM | POA: Diagnosis not present

## 2015-11-14 DIAGNOSIS — H9202 Otalgia, left ear: Secondary | ICD-10-CM | POA: Diagnosis not present

## 2015-11-14 DIAGNOSIS — J029 Acute pharyngitis, unspecified: Secondary | ICD-10-CM | POA: Diagnosis present

## 2015-11-14 DIAGNOSIS — Z3201 Encounter for pregnancy test, result positive: Secondary | ICD-10-CM | POA: Diagnosis not present

## 2015-11-14 LAB — POC URINE PREG, ED: PREG TEST UR: POSITIVE — AB

## 2015-11-14 LAB — GC/CHLAMYDIA PROBE AMP (~~LOC~~) NOT AT ARMC
CHLAMYDIA, DNA PROBE: NEGATIVE
Neisseria Gonorrhea: NEGATIVE

## 2015-11-14 LAB — RAPID STREP SCREEN (MED CTR MEBANE ONLY): Streptococcus, Group A Screen (Direct): NEGATIVE

## 2015-11-14 MED ORDER — ONDANSETRON 4 MG PO TBDP
4.0000 mg | ORAL_TABLET | Freq: Once | ORAL | Status: AC
  Administered 2015-11-14: 4 mg via ORAL
  Filled 2015-11-14: qty 1

## 2015-11-14 MED ORDER — AZITHROMYCIN 250 MG PO TABS
1000.0000 mg | ORAL_TABLET | Freq: Once | ORAL | Status: AC
  Administered 2015-11-14: 1000 mg via ORAL
  Filled 2015-11-14: qty 4

## 2015-11-14 MED ORDER — LIDOCAINE VISCOUS 2 % MT SOLN
15.0000 mL | Freq: Once | OROMUCOSAL | Status: AC
  Administered 2015-11-14: 15 mL via OROMUCOSAL
  Filled 2015-11-14: qty 15

## 2015-11-14 MED ORDER — CEFTRIAXONE SODIUM 250 MG IJ SOLR
250.0000 mg | Freq: Once | INTRAMUSCULAR | Status: AC
  Administered 2015-11-14: 250 mg via INTRAMUSCULAR
  Filled 2015-11-14: qty 250

## 2015-11-14 NOTE — ED Notes (Signed)
Pt now requesting to be tested for Chlamydia "since I'm here." She states her boyfriend told her he tested positive today. She denies any current symptoms.

## 2015-11-14 NOTE — Discharge Instructions (Signed)
Pharyngitis Take prenatal vitamins. Follow-up with women's outpatient clinic. Pharyngitis is a sore throat (pharynx). There is redness, pain, and swelling of your throat. HOME CARE   Drink enough fluids to keep your pee (urine) clear or pale yellow.  Only take medicine as told by your doctor.  You may get sick again if you do not take medicine as told. Finish your medicines, even if you start to feel better.  Do not take aspirin.  Rest.  Rinse your mouth (gargle) with salt water ( tsp of salt per 1 qt of water) every 1-2 hours. This will help the pain.  If you are not at risk for choking, you can suck on hard candy or sore throat lozenges. GET HELP IF:  You have large, tender lumps on your neck.  You have a rash.  You cough up green, yellow-brown, or bloody spit. GET HELP RIGHT AWAY IF:   You have a stiff neck.  You drool or cannot swallow liquids.  You throw up (vomit) or are not able to keep medicine or liquids down.  You have very bad pain that does not go away with medicine.  You have problems breathing (not from a stuffy nose). MAKE SURE YOU:   Understand these instructions.  Will watch your condition.  Will get help right away if you are not doing well or get worse.   This information is not intended to replace advice given to you by your health care provider. Make sure you discuss any questions you have with your health care provider.   Document Released: 03/10/2008 Document Revised: 07/13/2013 Document Reviewed: 05/30/2013 Elsevier Interactive Patient Education 2016 [employer].   Pregnancy and Sexually Transmitted Diseases A sexually transmitted disease (STD) is a disease or infection that may be passed (transmitted) from person to person, usually during sexual activity. This may happen by way of saliva, semen, blood, vaginal mucus, or urine. An STD can be caused by bacteria, viruses, or parasites.  During pregnancy, STDs can be dangerous for both you  and your unborn baby. It is important to take steps to reduce your chances of getting an STD. Also, you need to be looked at by your health care provider right away if you think you may have an STD or may have been exposed to an STD. Diagnosis and treatment will depend on the type of STD. If you are already pregnant, you will be screened for HIV (human immunodeficiency virus) early in your pregnancy. If you are at high risk for HIV, this test may be repeated during your third trimester of pregnancy. WHAT ARE SOME COMMON STDs? There are different types of STDs. Some STDs that cause problems in pregnancy include:  Gonorrhea.   Chlamydia.   Syphilis.   HIV and AIDS.   Genital herpes.   Hepatitis.   Genital warts.   Human papillomavirus (HPV). STDs that do not affect the baby include:   Trichomonas.   Pubic lice.  WHAT ARE THE POSSIBLE EFFECTS OF STDs DURING PREGNANCY? STDs can have various effects during pregnancy. STDs can cause:   Stillbirth.   Miscarriage.   Premature labor.   Premature rupture of the membranes.   Serious birth defects or deformities.   Infection of the amniotic sac.   Infections that occur after birth (postpartum) in you and the baby.   Slowed growth of the baby before birth.   Illnesses in newborns.  WHAT ARE COMMON SYMPTOMS OF STDs? Different STDs have different symptoms. Some women may not  have any symptoms. If symptoms are present, they may include:  Painful or bloody urination.   Pain in the pelvis, abdomen, vagina, anus, throat, or eyes.  A skin rash, itching, or irritation.   Growths, ulcerations, blisters, or sores in the genital and anal areas.   Fever.   Abnormal vaginal discharge with or without bad odor.   Pain or bleeding during sexual intercourse.   Yellow skin and eyes (jaundice). This is seen with hepatitis.   Swollen glands in the groin area.  Even if symptoms are not present, an STD can  still be passed to another person during sexual contact.  HOW ARE STDs DIAGNOSED? Your health care provider can determine if you have an STD through different tests. These can include blood tests, urine tests, and tests performed during a pelvic exam. You should be screened for sexually transmitted illnesses (STIs), including gonorrhea and chlamydia if:   You are sexually active and are younger than 25 years old.  You are older than 25 years old and your health care provider tells you that you are at risk for this type of infection.  Your sexual activity has changed since you were last screened, and you are at an increased risk for chlamydia or gonorrhea. Ask your health care provider if you are at risk. HOW CAN I REDUCE MY RISK OF GETTING AN STD?  Take these steps to reduce your risk of getting an STD:  Use a latex condom or female condom during sexual intercourse.   Use dental dams and water-soluble lubricants during sexual activity. Do not use petroleum jelly or oils.  Avoid having multiple sex partners.  Do not have sex with someone who has other sex partners.  Do not have sex with anyone you do not know or who is at high risk for an STD.   Avoid risky sex acts that can break the skin.  Do not have sex if you have open sores on your mouth or skin.  Avoid engaging in oral and anal sex acts.   Get the hepatitis vaccine. It is safe for pregnant women.  WHAT SHOULD I DO IF I THINK I HAVE AN STD?  See your health care provider.  Tell your sexual partner(s). They should be tested and treated for any STDs.  Do not have sex until your health care provider says it is okay. WHEN SHOULD I GET IMMEDIATE MEDICAL CARE? Contact your health care provider right away if:   You have any symptoms of an STD.  You think you or your sex partner has an STD, even if there are no symptoms.  You think you may have been exposed to an STD.   This information is not intended to replace advice  given to you by your health care provider. Make sure you discuss any questions you have with your health care provider.   Document Released: 10/30/2004 Document Revised: 10/13/2014 Document Reviewed: 04/21/2013 Elsevier Interactive Patient Education 2016 [employer].  Prenatal Care WHAT IS PRENATAL CARE?  Prenatal care is the process of caring for a pregnant woman before she gives birth. Prenatal care makes sure that she and her baby remain as healthy as possible throughout pregnancy. Prenatal care may be provided by a midwife, family practice health care provider, or a childbirth and pregnancy specialist (obstetrician). Prenatal care may include physical examinations, testing, treatments, and education on nutrition, lifestyle, and social support services. WHY IS PRENATAL CARE SO IMPORTANT?  Early and consistent prenatal care increases the chance  that you and your baby will remain healthy throughout your pregnancy. This type of care also decreases a baby's risk of being born too early (prematurely), or being born smaller than expected (small for gestational age). Any underlying medical conditions you may have that could pose a risk during your pregnancy are discussed during prenatal care visits. You will also be monitored regularly for any new conditions that may arise during your pregnancy so they can be treated quickly and effectively. WHAT HAPPENS DURING PRENATAL CARE VISITS? Prenatal care visits may include the following: Discussion Tell your health care provider about any new signs or symptoms you have experienced since your last visit. These might include:  Nausea or vomiting.  Increased or decreased level of energy.  Difficulty sleeping.  Back or leg pain.  Weight changes.  Frequent urination.  Shortness of breath with physical activity.  Changes in your skin, such as the development of a rash or itchiness.  Vaginal discharge or bleeding.  Feelings of excitement or  nervousness.  Changes in your baby's movements. You may want to write down any questions or topics you want to discuss with your health care provider and bring them with you to your appointment. Examination During your first prenatal care visit, you will likely have a complete physical exam. Your health care provider will often examine your vagina, cervix, and the position of your uterus, as well as check your heart, lungs, and other body systems. As your pregnancy progresses, your health care provider will measure the size of your uterus and your baby's position inside your uterus. He or she may also examine you for early signs of labor. Your prenatal visits may also include checking your blood pressure and, after about 10-12 weeks of pregnancy, listening to your baby's heartbeat. Testing Regular testing often includes:  Urinalysis. This checks your urine for glucose, protein, or signs of infection.  Blood count. This checks the levels of white and red blood cells in your body.  Tests for sexually transmitted infections (STIs). Testing for STIs at the beginning of pregnancy is routinely done and is required in many states.  Antibody testing. You will be checked to see if you are immune to certain illnesses, such as rubella, that can affect a developing fetus.  Glucose screen. Around 24-28 weeks of pregnancy, your blood glucose level will be checked for signs of gestational diabetes. Follow-up tests may be recommended.  Group B strep. This is a bacteria that is commonly found inside a woman's vagina. This test will inform your health care provider if you need an antibiotic to reduce the amount of this bacteria in your body prior to labor and childbirth.  Ultrasound. Many pregnant women undergo an ultrasound screening around 18-20 weeks of pregnancy to evaluate the health of the fetus and check for any developmental abnormalities.  HIV (human immunodeficiency virus) testing. Early in your  pregnancy, you will be screened for HIV. If you are at high risk for HIV, this test may be repeated during your third trimester of pregnancy. You may be offered other testing based on your age, personal or family medical history, or other factors.  HOW OFTEN SHOULD I PLAN TO SEE MY HEALTH CARE PROVIDER FOR PRENATAL CARE? Your prenatal care check-up schedule depends on any medical conditions you have before, or develop during, your pregnancy. If you do not have any underlying medical conditions, you will likely be seen for checkups:  Monthly, during the first 6 months of pregnancy.  Twice a month  during months 7 and 8 of pregnancy.  Weekly starting in the 9th month of pregnancy and until delivery. If you develop signs of early labor or other concerning signs or symptoms, you may need to see your health care provider more often. Ask your health care provider what prenatal care schedule is best for you. WHAT CAN I DO TO KEEP MYSELF AND MY BABY AS HEALTHY AS POSSIBLE DURING MY PREGNANCY?  Take a prenatal vitamin containing 400 micrograms (0.4 mg) of folic acid every day. Your health care provider may also ask you to take additional vitamins such as iodine, vitamin D, iron, copper, and zinc.  Take 1500-2000 mg of calcium daily starting at your 20th week of pregnancy until you deliver your baby.  Make sure you are up to date on your vaccinations. Unless directed otherwise by your health care provider:  You should receive a tetanus, diphtheria, and pertussis (Tdap) vaccination between the 27th and 36th week of your pregnancy, regardless of when your last Tdap immunization occurred. This helps protect your baby from whooping cough (pertussis) after he or she is born.  You should receive an annual inactivated influenza vaccine (IIV) to help protect you and your baby from influenza. This can be done at any point during your pregnancy.  Eat a well-rounded diet that includes:  Fresh fruits and  vegetables.  Lean proteins.  Calcium-rich foods such as milk, yogurt, hard cheeses, and dark, leafy greens.  Whole grain breads.  Do noteat seafood high in mercury, including:  Swordfish.  Tilefish.  Shark.  King mackerel.  More than 6 oz tuna per week.  Do not eat:  Raw or undercooked meats or eggs.  Unpasteurized foods, such as soft cheeses (brie, blue, or feta), juices, and milks.  Lunch meats.  Hot dogs that have not been heated until they are steaming.  Drink enough water to keep your urine clear or pale yellow. For many women, this may be 10 or more 8 oz glasses of water each day. Keeping yourself hydrated helps deliver nutrients to your baby and may prevent the start of pre-term uterine contractions.  Do not use any tobacco products including cigarettes, chewing tobacco, or electronic cigarettes. If you need help quitting, ask your health care provider.  Do not drink beverages containing alcohol. No safe level of alcohol consumption during pregnancy has been determined.  Do not use any illegal drugs. These can harm your developing baby or cause a miscarriage.  Ask your health care provider or pharmacist before taking any prescription or over-the-counter medicines, herbs, or supplements.  Limit your caffeine intake to no more than 200 mg per day.  Exercise. Unless told otherwise by your health care provider, try to get 30 minutes of moderate exercise most days of the week. Do not  do high-impact activities, contact sports, or activities with a high risk of falling, such as horseback riding or downhill skiing.  Get plenty of rest.  Avoid anything that raises your body temperature, such as hot tubs and saunas.  If you own a cat, do not empty its litter box. Bacteria contained in cat feces can cause an infection called toxoplasmosis. This can result in serious harm to the fetus.  Stay away from chemicals such as insecticides, lead, mercury, and cleaning or paint  products that contain solvents.  Do not have any X-rays taken unless medically necessary.  Take a childbirth and breastfeeding preparation class. Ask your health care provider if you need a referral or recommendation.  This information is not intended to replace advice given to you by your health care provider. Make sure you discuss any questions you have with your health care provider.   Document Released: 09/25/2003 Document Revised: 10/13/2014 Document Reviewed: 12/07/2013 Elsevier Interactive Patient Education [employer]

## 2015-11-14 NOTE — ED Provider Notes (Signed)
CSN: [id number]     Arrival date & time 11/14/15  0226 History   First MD Initiated Contact with Patient 11/14/15 [phone]     Chief Complaint  Patient presents with  . Sore Throat  . Otalgia  . Exposure to STD   (Consider location/radiation/quality/duration/timing/severity/associated sxs/prior Treatment) Patient is a 25 y.o. female presenting with pharyngitis, ear pain, and STD exposure. The history is provided by the patient. No language interpreter was used.  Sore Throat Associated symptoms include a sore throat. Pertinent negatives include no abdominal pain, chills, coughing, fever, nausea or vomiting.  Otalgia Associated symptoms: sore throat   Associated symptoms: no abdominal pain, no cough, no fever and no vomiting   Exposure to STD Associated symptoms include a sore throat. Pertinent negatives include no abdominal pain, chills, coughing, fever, nausea or vomiting.    Krista Rodgers is a 25 y.o female with a history of hip or tension and hyperlipidemia who presents for worsening left ear pain and sore throat for 3 days. She is also requesting to be tested for chlamydia because she was informed by her partner that he tested positive for chlamydia. Denies any history of STDs. States she's had 2 partners in the last 6 months. Denies any fever, chills, shortness of breath, cough, abdominal pain, nausea, vomiting, diarrhea, vaginal bleeding or discharge, dysuria, hematuria, or urinary frequency. States there could be a chance she is pregnant. Last menstrual period was the last week of December 2016.   Past Medical History  Diagnosis Date  . Hypertension   . Hyperlipidemia    History reviewed. No pertinent past surgical history. No family history on file. Social History  Substance Use Topics  . Smoking status: Never Smoker   . Smokeless tobacco: None  . Alcohol Use: No   OB History    No data available     Review of Systems  Constitutional: Negative for fever and chills.  HENT:  Positive for ear pain, sore throat and trouble swallowing.   Respiratory: Negative for cough and shortness of breath.   Gastrointestinal: Negative for nausea, vomiting and abdominal pain.  Genitourinary: Negative for flank pain, vaginal bleeding and pelvic pain.  All other systems reviewed and are negative.     Allergies  Amoxicillin and Peach  Home Medications   Prior to Admission medications   Medication Sig Start Date End Date Taking? Authorizing Provider  butalbital-acetaminophen-caffeine (FIORICET) 50-325-40 MG per tablet Take 1-2 tablets by mouth every 6 (six) hours as needed for headache. 11/27/14 11/27/15 Yes Thao P Le, DO  ibuprofen (ADVIL,MOTRIN) 200 MG tablet Take 600 mg by mouth daily as needed (pain).   Yes Historical Provider, MD   BP 134/99 mmHg  Pulse 74  Temp(Src) 98.6 F (37 C) (Oral)  Resp 16  SpO2 100%  LMP 09/28/2015 Physical Exam  Constitutional: She is oriented to person, place, and time. She appears well-developed and well-nourished. No distress.  HENT:  Head: Normocephalic and atraumatic.  Oropharynx is erythematous with tonsillar edema but no kissing tonsils are hot potato voice. No drooling or trismus. No tonsillar exudates. Uvula is midline. No anterior cervical lymphadenopathy.  Left TM is slightly erythematous but not bulging. No pain with movement of pinna and tragus. Ear canal looks normal. Right TM and canal are normal.  Eyes: Conjunctivae are normal.  Neck: Normal range of motion. Neck supple.  Cardiovascular: Normal rate, regular rhythm and normal heart sounds.   Pulmonary/Chest: Effort normal and breath sounds normal. No accessory muscle usage. No  respiratory distress. She has no decreased breath sounds. She has no wheezes. She has no rales.  Lungs clear to auscultation bilaterally.  Abdominal: Soft. She exhibits no distension. There is no tenderness. There is no rebound and no guarding.  Abdomen is soft and nontender. No guarding or rebound.   Musculoskeletal: Normal range of motion.  Neurological: She is alert and oriented to person, place, and time.  Skin: Skin is warm and dry.  Psychiatric: She has a normal mood and affect.  Nursing note and vitals reviewed.   ED Course  Procedures (including critical care time) Labs Review Labs Reviewed  POC URINE PREG, ED - Abnormal; Notable for the following:    Preg Test, Ur POSITIVE (*)    All other components within normal limits  RAPID STREP SCREEN (NOT AT [facility])  CULTURE, GROUP A STREP ([facility])  GC/CHLAMYDIA PROBE AMP ([place]) NOT AT [facility]    Imaging Review No results found. I have personally reviewed and evaluated these lab results as part of my medical decision-making.   EKG Interpretation None      MDM   Final diagnoses:  STD exposure  Pharyngitis  Pregnant   Patient presents for sore throat and left ear pain. Also would like STD check for chlamydia since boyfriend tested positive and informed her today. Her exam is not concerning. She has no symptoms such as dysuria, hematuria, urinary frequency, vaginal bleeding or discharge. Her last menstrual period was the last week of December. There is a chance that she could be pregnant according to her. She is not birth control. She tested positive for pregnancy while in the ED today. She also had a negative strep test. I discussed findings with the patient. Patient was told to take prenatal vitamins. Urine GC and chlamydia are pending. She was treated while in the ED for gonorrhea and chlamydia with ceftriaxone and azithromycin. She was given a referral to women's outpatient clinic. I informed her that if the results were positive she would hear from the hospital in the next few days.  Patient agreeable with plan.  Medications  azithromycin (ZITHROMAX) tablet 1,000 mg (1,000 mg Oral Given 11/14/15 0634)  cefTRIAXone (ROCEPHIN) injection 250 mg (250 mg Intramuscular Given 11/14/15 0635)  lidocaine (XYLOCAINE) 2 % viscous  mouth solution 15 mL (15 mLs Mouth/Throat Given 11/14/15 0652)  ondansetron (ZOFRAN-ODT) disintegrating tablet 4 mg (4 mg Oral Given 11/14/15 0730)   Filed Vitals:   11/14/15 0830 11/14/15 0834  BP: 134/99   Pulse: 74   Temp:  98.6 F (37 C)  Resp: 16 [address] PA-C 11/14/15 0855  Catha Gosselin, PA-C 11/14/15 1610  Tomasita Crumble, MD 11/14/15 1701

## 2015-11-14 NOTE — ED Notes (Signed)
Pt reports left ear pain and sore throat - painful to swallow. Symptoms started yesterday. Denies chills/fever/N/V/D

## 2015-11-14 NOTE — ED Notes (Signed)
Pt vomit x 1, PA aware.  New orders received.

## 2015-11-14 NOTE — ED Notes (Signed)
Per cytology, did not receive urine specimen.  Will collect another sample. Pt aware urine is needed.

## 2015-11-16 LAB — CULTURE, GROUP A STREP (THRC)

## 2016-08-04 ENCOUNTER — Encounter: Attending: Physician Assistant

## 2017-07-16 ENCOUNTER — Emergency Department (HOSPITAL_COMMUNITY): Payer: 59

## 2017-07-16 ENCOUNTER — Emergency Department (HOSPITAL_COMMUNITY)
Admission: EM | Admit: 2017-07-16 | Discharge: 2017-07-16 | Disposition: A | Discharge status: Home / Self Care | Payer: 59 | Attending: Emergency Medicine | Admitting: Emergency Medicine

## 2017-07-16 ENCOUNTER — Encounter (HOSPITAL_COMMUNITY): Payer: Self-pay | Admitting: Radiology

## 2017-07-16 DIAGNOSIS — R112 Nausea with vomiting, unspecified: Secondary | ICD-10-CM | POA: Insufficient documentation

## 2017-07-16 DIAGNOSIS — R102 Pelvic and perineal pain: Secondary | ICD-10-CM | POA: Diagnosis present

## 2017-07-16 DIAGNOSIS — I10 Essential (primary) hypertension: Secondary | ICD-10-CM | POA: Diagnosis not present

## 2017-07-16 DIAGNOSIS — N83209 Unspecified ovarian cyst, unspecified side: Secondary | ICD-10-CM

## 2017-07-16 LAB — CBC
HCT: 38.7 % (ref 36.0–46.0)
HEMOGLOBIN: 12.5 g/dL (ref 12.0–15.0)
MCH: 26.2 pg (ref 26.0–34.0)
MCHC: 32.3 g/dL (ref 30.0–36.0)
MCV: 81 fL (ref 78.0–100.0)
Platelets: 232 10*3/uL (ref 150–400)
RBC: 4.78 MIL/uL (ref 3.87–5.11)
RDW: 13.2 % (ref 11.5–15.5)
WBC: 9.5 10*3/uL (ref 4.0–10.5)

## 2017-07-16 LAB — COMPREHENSIVE METABOLIC PANEL
ALK PHOS: 57 U/L (ref 38–126)
ALT: 12 U/L — ABNORMAL LOW (ref 14–54)
ANION GAP: 7 (ref 5–15)
AST: 19 U/L (ref 15–41)
Albumin: 4 g/dL (ref 3.5–5.0)
BUN: 6 mg/dL (ref 6–20)
CO2: 22 mmol/L (ref 22–32)
Calcium: 9 mg/dL (ref 8.9–10.3)
Chloride: 108 mmol/L (ref 101–111)
Creatinine, Ser: 0.77 mg/dL (ref 0.44–1.00)
GFR calc Af Amer: >60 mL/min (ref >60)
GFR calc non Af Amer: >60 mL/min (ref >60)
Glucose, Bld: 95 mg/dL (ref 65–99)
Potassium: 3.5 mmol/L (ref 3.5–5.1)
SODIUM: 137 mmol/L (ref 135–145)
Total Bilirubin: 0.8 mg/dL (ref 0.3–1.2)
Total Protein: 7.3 g/dL (ref 6.5–8.1)

## 2017-07-16 LAB — URINALYSIS, ROUTINE W REFLEX MICROSCOPIC
Bilirubin Urine: NEGATIVE
Glucose, UA: NEGATIVE mg/dL
HGB URINE DIPSTICK: NEGATIVE
Ketones, ur: NEGATIVE mg/dL
LEUKOCYTES UA: NEGATIVE
Nitrite: NEGATIVE
PROTEIN: NEGATIVE mg/dL
SPECIFIC GRAVITY, URINE: 1.009 (ref 1.005–1.030)
pH: 7 (ref 5.0–8.0)

## 2017-07-16 LAB — WET PREP, GENITAL
SPERM: NONE SEEN
TRICH WET PREP: NONE SEEN
YEAST WET PREP: NONE SEEN

## 2017-07-16 LAB — I-STAT BETA HCG BLOOD, ED (MC, WL, AP ONLY): I-stat hCG, quantitative: <5 m[IU]/mL (ref <5)

## 2017-07-16 LAB — LIPASE, BLOOD: Lipase: 24 U/L (ref 11–51)

## 2017-07-16 LAB — I-STAT CG4 LACTIC ACID, ED: LACTIC ACID, VENOUS: 0.9 mmol/L (ref 0.5–1.9)

## 2017-07-16 MED ORDER — SODIUM CHLORIDE 0.9 % IV BOLUS (SEPSIS)
1000.0000 mL | Freq: Once | INTRAVENOUS | Status: AC
  Administered 2017-07-16: 1000 mL via INTRAVENOUS

## 2017-07-16 MED ORDER — MORPHINE SULFATE (PF) 4 MG/ML IV SOLN
4.0000 mg | Freq: Once | INTRAVENOUS | Status: AC
  Administered 2017-07-16: 4 mg via INTRAVENOUS
  Filled 2017-07-16: qty 1

## 2017-07-16 MED ORDER — KETOROLAC TROMETHAMINE 30 MG/ML IJ SOLN
30.0000 mg | Freq: Once | INTRAMUSCULAR | Status: AC
  Administered 2017-07-16: 30 mg via INTRAVENOUS
  Filled 2017-07-16: qty 1

## 2017-07-16 MED ORDER — TRAMADOL HCL 50 MG PO TABS: 50.0000 mg | ORAL_TABLET | Freq: Four times a day (QID) | ORAL | 0 refills | Status: DC | PRN

## 2017-07-16 MED ORDER — ONDANSETRON HCL 4 MG/2ML IJ SOLN
4.0000 mg | Freq: Once | INTRAMUSCULAR | Status: AC
  Administered 2017-07-16: 4 mg via INTRAVENOUS
  Filled 2017-07-16: qty 2

## 2017-07-16 MED ORDER — DIPHENHYDRAMINE HCL 50 MG/ML IJ SOLN
50.0000 mg | Freq: Once | INTRAMUSCULAR | Status: AC
  Administered 2017-07-16: 50 mg via INTRAVENOUS
  Filled 2017-07-16: qty 1

## 2017-07-16 MED ORDER — IOPAMIDOL (ISOVUE-300) INJECTION 61%
INTRAVENOUS | Status: AC
  Administered 2017-07-16: 100 mL
  Filled 2017-07-16: qty 100

## 2017-07-16 NOTE — ED Notes (Signed)
Patient taken to CT.

## 2017-07-16 NOTE — ED Provider Notes (Signed)
[facility]-EMERGENCY DEPT Provider Note   CSN: [id number] Arrival date & time: 07/16/17  1436     History   Chief Complaint Chief Complaint  Patient presents with  . Possible Pregnancy  . Emesis    HPI Krista Rodgers is a 26 y.o. female here presenting with periumbilical and right lower quadrant pain. Patient states that for the last 4 days, she's been having periumbilical pain as well as mild right lower quadrant pain. Associated with some nausea and vomiting as well. Denies any diarrhea or fevers. She states that she is about 9 days late for her menstrual period but denies vaginal discharge.     The history is provided by the patient.    Past Medical History:  Diagnosis Date  . Hyperlipidemia     Patient Active Problem List   Diagnosis Date Noted  . Essential hypertension 11/29/2014    No past surgical history on file.  OB History    No data available       Home Medications    Prior to Admission medications   Medication Sig Start Date End Date Taking? Authorizing Provider  ibuprofen (ADVIL,MOTRIN) 200 MG tablet Take 600 mg by mouth daily as needed (pain).   Yes [provider]    Family History No family history on file.  Social History Social History  Substance Use Topics  . Smoking status: Never Smoker  . Smokeless tobacco: Not on file  . Alcohol use No     Allergies   Amoxicillin and Peach [prunus persica]   Review of Systems Review of Systems  Gastrointestinal: Positive for abdominal pain.  All other systems reviewed and are negative.    Physical Exam Updated Vital Signs BP (!) 136/104   Pulse 88   Temp 100 F (37.8 C) (Oral)   Resp 16   Ht 5' (1.524 m)   Wt 68 kg (150 lb)   SpO2 100%   BMI 29.29 kg/m   Physical Exam  Constitutional: She is oriented to person, place, and time. She appears well-developed.  HENT:  Head: Normocephalic.  Eyes: Pupils are equal, round, and reactive to light. Conjunctivae and EOM are normal.    Neck: Normal range of motion. Neck supple.  Cardiovascular: Normal rate, regular rhythm and normal heart sounds.   Pulmonary/Chest: Effort normal and breath sounds normal. No respiratory distress. She has no wheezes. She has no rales.  Abdominal: Soft. Bowel sounds are normal.  Mild periumbilical and RLQ tenderness   Genitourinary:  Genitourinary Comments: Whitish discharge, no CMT or adnexal tenderness   Musculoskeletal: Normal range of motion.  Neurological: She is alert and oriented to person, place, and time.  Skin: Skin is warm.  Psychiatric: She has a normal mood and affect.  Nursing note and vitals reviewed.    ED Treatments / Results  Labs (all labs ordered are listed, but only abnormal results are displayed) Labs Reviewed  WET PREP, GENITAL - Abnormal; Notable for the following:       Result Value   Clue Cells Wet Prep HPF POC PRESENT (*)    WBC, Wet Prep HPF POC FEW (*)    All other components within normal limits  COMPREHENSIVE METABOLIC PANEL - Abnormal; Notable for the following:    ALT 12 (*)    All other components within normal limits  URINALYSIS, ROUTINE W REFLEX MICROSCOPIC - Abnormal; Notable for the following:    Color, Urine STRAW (*)    All other components within normal limits  LIPASE,  BLOOD  CBC  I-STAT CG4 LACTIC ACID, ED  I-STAT BETA HCG BLOOD, ED ([facility], [facility], [facility] ONLY)  GC/CHLAMYDIA PROBE AMP ([place]) NOT AT [facility]    EKG  EKG Interpretation None       Radiology Ct Abdomen Pelvis W Contrast  Result Date: 07/16/2017 CLINICAL DATA:  26 year old female with periumbilical abdominal pain nausea, vomiting and fever for 1 day. EXAM: CT ABDOMEN AND PELVIS WITH CONTRAST TECHNIQUE: Multidetector CT imaging of the abdomen and pelvis was performed using the standard protocol following bolus administration of intravenous contrast. CONTRAST:  100 mL Isovue-300 COMPARISON:  Ob ultrasound 05/03/2015. FINDINGS: Lower chest: Normal lung bases.  No pericardial  or pleural effusion. Hepatobiliary: Negative liver and gallbladder. Pancreas: Negative. Spleen: Negative. Adrenals/Urinary Tract: Normal adrenal glands. Bilateral renal enhancement is normal. Bilateral extrarenal pelves suspected (normal variant). No periureteral stranding. No urologic calculus identified. Unremarkable urinary bladder Stomach/Bowel: Negative rectum and sigmoid colon. Left colon and transverse colon are within normal limits. Right colon is within normal limits. Normal appendix (coronal image 23). Negative terminal ileum. No dilated small bowel. Decompressed stomach and duodenum. No abdominal free fluid or free air. Vascular/Lymphatic: Major vascular structures including the portal venous system appear normal. No lymphadenopathy. Reproductive: Within normal limits for age; probable physiologic heterogeneity of the right ovary on series 3, image 53. Other: Incidental genital piercing. Small volume pelvic free fluid in the cul-de-sac. Musculoskeletal: Negative. IMPRESSION: 1. Negative CT abdomen.  Normal appendix. 2. Small volume pelvic free fluid in the cul de sac and heterogeneity of the right ovary are most likely physiologic. Electronically Signed   By: Odessa Fleming M.D.   On: 07/16/2017 18:52   US Pelvic Doppler (torsion R/o Or Mass Arterial Flow)  Result Date: 07/16/2017 CLINICAL DATA:  Initial evaluation for acute right-sided pelvic pain for 4 days, nausea, vomiting. EXAM: TRANSABDOMINAL AND TRANSVAGINAL ULTRASOUND OF PELVIS DOPPLER ULTRASOUND OF OVARIES TECHNIQUE: Both transabdominal and transvaginal ultrasound examinations of the pelvis were performed. Transabdominal technique was performed for global imaging of the pelvis including uterus, ovaries, adnexal regions, and pelvic cul-de-sac. It was necessary to proceed with endovaginal exam following the transabdominal exam to visualize the uterus and ovaries. Color and duplex Doppler ultrasound was utilized to evaluate blood flow to the ovaries.  COMPARISON:  Prior CT from the same day. FINDINGS: Uterus Measurements: 6.5 x 4.1 x 4.5 cm. No fibroids or other mass visualized. Endometrium Thickness: 9.4 mm.  No focal abnormality visualized. Right ovary Measurements: 3.8 x 1.9 x 2.8 cm. Normal appearance/no adnexal mass. Left ovary Measurements: 3.4 x 1.8 x 2.4 cm. Normal appearance/no adnexal mass. Pulsed Doppler evaluation of both ovaries demonstrates normal low-resistance arterial and venous waveforms. Other findings Small volume free fluid within the pelvis, most likely physiologic. IMPRESSION: 1. Normal pelvic ultrasound with no acute abnormality identified. No evidence for ovarian torsion. 2. Small volume free physiologic fluid within the pelvis. Electronically Signed   By: Rise Mu M.D.   On: 07/16/2017 21:13   US Pelvic Complete With Transvaginal  Result Date: 07/16/2017 CLINICAL DATA:  Initial evaluation for acute right-sided pelvic pain for 4 days, nausea, vomiting. EXAM: TRANSABDOMINAL AND TRANSVAGINAL ULTRASOUND OF PELVIS DOPPLER ULTRASOUND OF OVARIES TECHNIQUE: Both transabdominal and transvaginal ultrasound examinations of the pelvis were performed. Transabdominal technique was performed for global imaging of the pelvis including uterus, ovaries, adnexal regions, and pelvic cul-de-sac. It was necessary to proceed with endovaginal exam following the transabdominal exam to visualize the uterus and ovaries. Color and duplex Doppler  ultrasound was utilized to evaluate blood flow to the ovaries. COMPARISON:  Prior CT from the same day. FINDINGS: Uterus Measurements: 6.5 x 4.1 x 4.5 cm. No fibroids or other mass visualized. Endometrium Thickness: 9.4 mm.  No focal abnormality visualized. Right ovary Measurements: 3.8 x 1.9 x 2.8 cm. Normal appearance/no adnexal mass. Left ovary Measurements: 3.4 x 1.8 x 2.4 cm. Normal appearance/no adnexal mass. Pulsed Doppler evaluation of both ovaries demonstrates normal low-resistance arterial and  venous waveforms. Other findings Small volume free fluid within the pelvis, most likely physiologic. IMPRESSION: 1. Normal pelvic ultrasound with no acute abnormality identified. No evidence for ovarian torsion. 2. Small volume free physiologic fluid within the pelvis. Electronically Signed   By: Rise Mu M.D.   On: 07/16/2017 21:13    Procedures Procedures (including critical care time)  Medications Ordered in ED Medications  sodium chloride 0.9 % bolus 1,000 mL (0 mLs Intravenous Stopped 07/16/17 1928)  ondansetron (ZOFRAN) injection 4 mg (4 mg Intravenous Given 07/16/17 1742)  morphine 4 MG/ML injection 4 mg (4 mg Intravenous Given 07/16/17 1809)  iopamidol (ISOVUE-300) 61 % injection (100 mLs  Contrast Given 07/16/17 1830)  ketorolac (TORADOL) 30 MG/ML injection 30 mg (30 mg Intravenous Given 07/16/17 1928)  diphenhydrAMINE (BENADRYL) injection 50 mg (50 mg Intravenous Given 07/16/17 1949)  morphine 4 MG/ML injection 4 mg (4 mg Intravenous Given 07/16/17 2123)     Initial Impression / Assessment and Plan / ED Course  I have reviewed the triage vital signs and the nursing notes.  Pertinent labs & imaging results that were available during my care of the patient were reviewed by me and considered in my medical decision making (see chart for details).     Krista Rodgers is a 26 y.o. female here with periumbilical and RLQ pain. Pelvic exam unremarkable, HCG neg. Likely appy vs ruptured cyst. Will get labs, CT ab/pel  8 pm CT showed nl appendix, free fluid in RLQ. Still in pain. Ordered toradol and then she had allergic reaction with rash and subjective shortness of breath. Given benadryl and symptoms improved. Will order transvag US.  9:42 PM US showed free fluid in RLQ, likely ruptured cyst. No torsion. Pain improved. Of note, she had some clue cells but she had it all the time and has no discharge so will hold off on abx.   Final Clinical Impressions(s) / ED Diagnoses     Final diagnoses:  Pelvic pain  Pelvic pain    New Prescriptions New Prescriptions   No medications on file     Charlynne Pander, MD 07/16/17 2142

## 2017-07-16 NOTE — ED Triage Notes (Signed)
Pt reports right lower abd pain X4 days. Some vomiting noted. Pt also states she is 9 days late for her menstrual cycle. Pt tender in RLQ.

## 2017-07-16 NOTE — ED Notes (Signed)
Patient stated she was having trouble breathing after Toradol administration.  Patient was itching all over but able to talk in complete sentences.

## 2017-07-16 NOTE — ED Notes (Signed)
Patient Alert and oriented X4. Stable and ambulatory. Patient verbalized understanding of the discharge instructions.  Patient belongings were taken by the patient.  

## 2017-07-16 NOTE — ED Notes (Signed)
Pt. Reports feeling itchy on her skin and itchy in her throat and jittery. RN Arlys John informed

## 2017-07-16 NOTE — Discharge Instructions (Signed)
Take tylenol for pain.   Take tramadol for severe pain.   You had an ovarian cyst that ruptured and there is no cyst currently.   See your doctor  Return to ER if you have worse abdominal pain, vomiting, fevers.

## 2017-07-17 LAB — GC/CHLAMYDIA PROBE AMP (~~LOC~~) NOT AT ARMC
CHLAMYDIA, DNA PROBE: NEGATIVE
Neisseria Gonorrhea: NEGATIVE

## 2017-09-03 ENCOUNTER — Emergency Department (HOSPITAL_COMMUNITY): Payer: 59

## 2017-09-03 ENCOUNTER — Emergency Department (HOSPITAL_COMMUNITY)
Admission: EM | Admit: 2017-09-03 | Discharge: 2017-09-03 | Disposition: A | Discharge status: Home / Self Care | Payer: 59 | Attending: Emergency Medicine | Admitting: Emergency Medicine

## 2017-09-03 ENCOUNTER — Encounter (HOSPITAL_COMMUNITY): Payer: Self-pay

## 2017-09-03 DIAGNOSIS — E785 Hyperlipidemia, unspecified: Secondary | ICD-10-CM | POA: Diagnosis not present

## 2017-09-03 DIAGNOSIS — I10 Essential (primary) hypertension: Secondary | ICD-10-CM | POA: Diagnosis not present

## 2017-09-03 DIAGNOSIS — R1031 Right lower quadrant pain: Secondary | ICD-10-CM | POA: Diagnosis present

## 2017-09-03 DIAGNOSIS — R109 Unspecified abdominal pain: Secondary | ICD-10-CM

## 2017-09-03 LAB — CBC WITH DIFFERENTIAL/PLATELET
Basophils Absolute: 0 10*3/uL (ref 0.0–0.1)
Basophils Relative: 0 %
Eosinophils Absolute: 0.1 10*3/uL (ref 0.0–0.7)
Eosinophils Relative: 1 %
HCT: 38 % (ref 36.0–46.0)
Hemoglobin: 12.4 g/dL (ref 12.0–15.0)
Lymphocytes Relative: 24 %
Lymphs Abs: 1.8 10*3/uL (ref 0.7–4.0)
MCH: 26.5 pg (ref 26.0–34.0)
MCHC: 32.6 g/dL (ref 30.0–36.0)
MCV: 81.2 fL (ref 78.0–100.0)
Monocytes Absolute: 0.3 10*3/uL (ref 0.1–1.0)
Monocytes Relative: 5 %
Neutro Abs: 5.3 10*3/uL (ref 1.7–7.7)
Neutrophils Relative %: 70 %
Platelets: 214 10*3/uL (ref 150–400)
RBC: 4.68 MIL/uL (ref 3.87–5.11)
RDW: 13.6 % (ref 11.5–15.5)
WBC: 7.6 10*3/uL (ref 4.0–10.5)

## 2017-09-03 LAB — COMPREHENSIVE METABOLIC PANEL
ALT: 13 U/L — ABNORMAL LOW (ref 14–54)
AST: 19 U/L (ref 15–41)
Albumin: 3.8 g/dL (ref 3.5–5.0)
Alkaline Phosphatase: 53 U/L (ref 38–126)
Anion gap: 5 (ref 5–15)
BUN: 8 mg/dL (ref 6–20)
CO2: 24 mmol/L (ref 22–32)
Calcium: 8.7 mg/dL — ABNORMAL LOW (ref 8.9–10.3)
Chloride: 106 mmol/L (ref 101–111)
Creatinine, Ser: 0.7 mg/dL (ref 0.44–1.00)
GFR calc Af Amer: >60 mL/min (ref >60)
GFR calc non Af Amer: >60 mL/min (ref >60)
Glucose, Bld: 87 mg/dL (ref 65–99)
Potassium: 3.5 mmol/L (ref 3.5–5.1)
Sodium: 135 mmol/L (ref 135–145)
Total Bilirubin: 0.6 mg/dL (ref 0.3–1.2)
Total Protein: 7 g/dL (ref 6.5–8.1)

## 2017-09-03 LAB — LIPASE, BLOOD: Lipase: 28 U/L (ref 11–51)

## 2017-09-03 LAB — URINALYSIS, ROUTINE W REFLEX MICROSCOPIC
Bilirubin Urine: NEGATIVE
Glucose, UA: NEGATIVE mg/dL
Hgb urine dipstick: NEGATIVE
KETONES UR: NEGATIVE mg/dL
LEUKOCYTES UA: NEGATIVE
NITRITE: NEGATIVE
PROTEIN: NEGATIVE mg/dL
Specific Gravity, Urine: 1.025 (ref 1.005–1.030)
pH: 5 (ref 5.0–8.0)

## 2017-09-03 LAB — POC URINE PREG, ED: PREG TEST UR: NEGATIVE

## 2017-09-03 MED ORDER — IOPAMIDOL (ISOVUE-300) INJECTION 61%
INTRAVENOUS | Status: AC
  Filled 2017-09-03: qty 100

## 2017-09-03 MED ORDER — KETOROLAC TROMETHAMINE 30 MG/ML IJ SOLN
30.0000 mg | Freq: Once | INTRAMUSCULAR | Status: DC
  Filled 2017-09-03: qty 1

## 2017-09-03 MED ORDER — ACETAMINOPHEN 325 MG PO TABS
650.0000 mg | ORAL_TABLET | Freq: Once | ORAL | Status: AC
  Administered 2017-09-03: 650 mg via ORAL
  Filled 2017-09-03: qty 2

## 2017-09-03 NOTE — ED Notes (Signed)
Pt voices understanding of discharge instructions and reasons for return. NAD. Ambulatory.

## 2017-09-03 NOTE — ED Triage Notes (Signed)
Patient complains of abdominal cramping for several days, states that she has missed her LMP by 2 weeks, NAD

## 2017-09-03 NOTE — ED Provider Notes (Signed)
[facility] Provider Note   CSN: [id number] Arrival date & time: 09/03/17  1051     History   Chief Complaint No chief complaint on file.   HPI Krista Rodgers is a 26 y.o. female.  HPI   26 year old female presents today with complaints of abdominal pain.  Patient reports that she was in her usual state of health last night.  Woke up in the middle of the night with right lower quadrant abdominal pain.  She describes this as sharp in nature coming and going.  She reports some nausea, denies any vomiting.  She denies any fever, vaginal bleeding, discharge, or dysuria.  Patient reports her last bowel movement was yesterday and was normal.  LMP October 19 approximately 10 days late.  Patient notes she has had right-sided abdominal pain in the past, she reports that this pain is higher up in her abdomen, and feels different than previous.   Past Medical History:  Diagnosis Date  . Hyperlipidemia     Patient Active Problem List   Diagnosis Date Noted  . Essential hypertension 11/29/2014    History reviewed. No pertinent surgical history.  OB History    No data available       Home Medications    Prior to Admission medications   Medication Sig Start Date End Date Taking? Authorizing Provider  ibuprofen (ADVIL,MOTRIN) 200 MG tablet Take 600 mg by mouth daily as needed (pain).    [provider]  traMADol (ULTRAM) 50 MG tablet Take 1 tablet (50 mg total) by mouth every 6 (six) hours as needed. 07/16/17   Charlynne PanderYao, David Hsienta, MD    Family History No family history on file.  Social History Social History   Tobacco Use  . Smoking status: Never Smoker  Substance Use Topics  . Alcohol use: No  . Drug use: No     Allergies   Toradol [ketorolac tromethamine]; Amoxicillin; and Peach [prunus persica]   Review of Systems Review of Systems   Physical Exam Updated Vital Signs BP (!) 149/98   Pulse 85   Temp 99 F  (37.2 C) (Oral)   Resp 18   SpO2 100%   Physical Exam  Constitutional: She is oriented to person, place, and time. She appears well-developed and well-nourished.  HENT:  Head: Normocephalic and atraumatic.  Eyes: Conjunctivae are normal. Pupils are equal, round, and reactive to light. Right eye exhibits no discharge. Left eye exhibits no discharge. No scleral icterus.  Neck: Normal range of motion. No JVD present. No tracheal deviation present.  Pulmonary/Chest: Effort normal. No stridor.  Abdominal:   tenderness palpation of right upper and lower quadrant, no pelvic tenderness, no upper abdominal tenderness  Neurological: She is alert and oriented to person, place, and time. Coordination normal.  Psychiatric: She has a normal mood and affect. Her behavior is normal. Judgment and thought content normal.  Nursing note and vitals reviewed.    ED Treatments / Results  Labs (all labs ordered are listed, but only abnormal results are displayed) Labs Reviewed  COMPREHENSIVE METABOLIC PANEL - Abnormal; Notable for the following components:      Result Value   Calcium 8.7 (*)    ALT 13 (*)    All other components within normal limits  URINALYSIS, ROUTINE W REFLEX MICROSCOPIC  CBC WITH DIFFERENTIAL/PLATELET  LIPASE, BLOOD  POC URINE PREG, ED    EKG  EKG Interpretation None       Radiology Ct Abdomen Pelvis  W Contrast  Result Date: 09/03/2017 CLINICAL DATA:  Abdominal cramping for several days. EXAM: CT ABDOMEN AND PELVIS WITH CONTRAST TECHNIQUE: Multidetector CT imaging of the abdomen and pelvis was performed using the standard protocol following bolus administration of intravenous contrast. CONTRAST:  <See Chart> ISOVUE-300 IOPAMIDOL (ISOVUE-300) INJECTION 61% COMPARISON:  07/16/2017 FINDINGS: Lower chest:  Unremarkable Hepatobiliary: No focal abnormality within the liver parenchyma. There is no evidence for gallstones, gallbladder wall thickening, or pericholecystic fluid. No  intrahepatic or extrahepatic biliary dilation. Pancreas: No focal mass lesion. No dilatation of the main duct. No intraparenchymal cyst. No peripancreatic edema. Spleen: No splenomegaly. No focal mass lesion. Adrenals/Urinary Tract: No adrenal nodule or mass. Kidneys are unremarkable. No evidence for hydroureter. The urinary bladder appears normal for the degree of distention. Stomach/Bowel: Stomach is nondistended. No gastric wall thickening. No evidence of outlet obstruction. Duodenum is normally positioned as is the ligament of Treitz. No small bowel wall thickening. No small bowel dilatation. The terminal ileum is normal. The appendix is normal. No gross colonic mass. No colonic wall thickening. No substantial diverticular change. Vascular/Lymphatic: No abdominal aortic aneurysm. No abdominal aortic atherosclerotic calcification. There is no gastrohepatic or hepatoduodenal ligament lymphadenopathy. No intraperitoneal or retroperitoneal lymphadenopathy. No pelvic sidewall lymphadenopathy. Reproductive: The uterus has normal CT imaging appearance. There is no adnexal mass. Other: Very trace free fluid noted in the cul-de-sac which can be physiologic in a premenopausal female. Musculoskeletal: Bone windows reveal no worrisome lytic or sclerotic osseous lesions. IMPRESSION: 1. No CT findings to explain the patient's history of abdominal cramping with pain and distention. No acute findings in the abdomen or pelvis. Electronically Signed   By: Kennith CenterEric  Mansell M.D.   On: 09/03/2017 13:31    Procedures Procedures (including critical care time)  Medications Ordered in ED Medications  iopamidol (ISOVUE-300) 61 % injection (100 mLs  Incomplete 09/03/17 1258)  ketorolac (TORADOL) 30 MG/ML injection 30 mg (30 mg Intravenous Refused 09/03/17 1352)  acetaminophen (TYLENOL) tablet 650 mg (650 mg Oral Given 09/03/17 1359)     Initial Impression / Assessment and Plan / ED Course  I have reviewed the triage vital  signs and the nursing notes.  Pertinent labs & imaging results that were available during my care of the patient were reviewed by me and considered in my medical decision making (see chart for details).     Final Clinical Impressions(s) / ED Diagnoses   Final diagnoses:  Abdominal pain, unspecified abdominal location   Labs: CBC, CMP lipase, point-of-care urine pregnant  Imaging: CT abdomen pelvis with contrast  Consults:  Therapeutics: tylenol  Discharge Meds:   Assessment/Plan: 26 year old female presents today with complaints of right sided abdominal pain.  Patient has exquisite tenderness on my exam.  Pain is coming and going.  Patient will have labs drawn and CT scan to rule out any acute intra-abdominal pathology.  I have lower suspicion for ovarian torsion or pelvic pathology given patient's description and location, this will remain on the differential.  Patient denies any vaginal bleeding or discharge at this time.  CT scan shows no acute findings.  Upon reassessment patient has improvement in symptoms after Tylenol.  Patient does not have any pelvic pain, I have low suspicion for reproductive origin.  Patient was seen approximately 1 month ago for somewhat similar symptoms with no acute findings with suspicion for ruptured  ovarian cyst.  Patient is well-appearing and stable for outpatient follow-up.  She is encouraged to follow-up with her primary care provider for  reassessment return immediately for any new or worsening signs or symptoms.  Patient verbalized understanding and agreement to today's plan had no further questions or concerns.   ED Discharge Orders    None       Eyvonne Mechanic, Cordelia Poche 09/03/17 1438    Little, Ambrose Finland, MD 09/06/17 2125

## 2017-09-03 NOTE — ED Notes (Signed)
E sig box not pulling up. Pt voices understanding of discharge.

## 2017-09-03 NOTE — Discharge Instructions (Signed)
Please read attached information. If you experience any new or worsening signs or symptoms please return to the emergency room for evaluation. Please follow-up with your primary care provider or specialist as discussed.  Please use Tylenol or ibuprofen as needed for discomfort. °

## 2017-10-27 ENCOUNTER — Emergency Department (HOSPITAL_COMMUNITY): Payer: 59

## 2017-10-27 ENCOUNTER — Encounter (HOSPITAL_COMMUNITY): Payer: Self-pay

## 2017-10-27 ENCOUNTER — Emergency Department (HOSPITAL_COMMUNITY)
Admission: EM | Admit: 2017-10-27 | Discharge: 2017-10-27 | Disposition: A | Discharge status: Home / Self Care | Payer: 59 | Attending: Emergency Medicine | Admitting: Emergency Medicine

## 2017-10-27 DIAGNOSIS — R51 Headache: Secondary | ICD-10-CM | POA: Insufficient documentation

## 2017-10-27 DIAGNOSIS — I10 Essential (primary) hypertension: Secondary | ICD-10-CM | POA: Insufficient documentation

## 2017-10-27 DIAGNOSIS — R519 Headache, unspecified: Secondary | ICD-10-CM

## 2017-10-27 LAB — I-STAT BETA HCG BLOOD, ED (MC, WL, AP ONLY): I-stat hCG, quantitative: <5 m[IU]/mL (ref <5)

## 2017-10-27 LAB — CBC
HEMATOCRIT: 37 % (ref 36.0–46.0)
Hemoglobin: 11.9 g/dL — ABNORMAL LOW (ref 12.0–15.0)
MCH: 27 pg (ref 26.0–34.0)
MCHC: 32.2 g/dL (ref 30.0–36.0)
MCV: 83.9 fL (ref 78.0–100.0)
PLATELETS: 213 10*3/uL (ref 150–400)
RBC: 4.41 MIL/uL (ref 3.87–5.11)
RDW: 13.6 % (ref 11.5–15.5)
WBC: 6.2 10*3/uL (ref 4.0–10.5)

## 2017-10-27 LAB — BASIC METABOLIC PANEL
Anion gap: 10 (ref 5–15)
BUN: 8 mg/dL (ref 6–20)
CHLORIDE: 110 mmol/L (ref 101–111)
CO2: 22 mmol/L (ref 22–32)
Calcium: 8.7 mg/dL — ABNORMAL LOW (ref 8.9–10.3)
Creatinine, Ser: 0.72 mg/dL (ref 0.44–1.00)
GFR calc Af Amer: >60 mL/min (ref >60)
GLUCOSE: 95 mg/dL (ref 65–99)
Potassium: 3.7 mmol/L (ref 3.5–5.1)
SODIUM: 142 mmol/L (ref 135–145)

## 2017-10-27 MED ORDER — PROMETHAZINE HCL 25 MG/ML IJ SOLN
25.0000 mg | Freq: Once | INTRAMUSCULAR | Status: AC
  Administered 2017-10-27: 25 mg via INTRAVENOUS
  Filled 2017-10-27: qty 1

## 2017-10-27 MED ORDER — SODIUM CHLORIDE 0.9 % IV SOLN
INTRAVENOUS | Status: DC
  Administered 2017-10-27: 13:00:00 via INTRAVENOUS

## 2017-10-27 MED ORDER — ONDANSETRON 4 MG PO TBDP: 4.0000 mg | ORAL_TABLET | Freq: Three times a day (TID) | ORAL | 0 refills | Status: DC | PRN

## 2017-10-27 MED ORDER — SODIUM CHLORIDE 0.9 % IV BOLUS (SEPSIS)
1000.0000 mL | Freq: Once | INTRAVENOUS | Status: AC
  Administered 2017-10-27: 1000 mL via INTRAVENOUS

## 2017-10-27 MED ORDER — ONDANSETRON HCL 4 MG/2ML IJ SOLN
4.0000 mg | Freq: Once | INTRAMUSCULAR | Status: AC
  Administered 2017-10-27: 4 mg via INTRAVENOUS
  Filled 2017-10-27: qty 2

## 2017-10-27 MED ORDER — HYDROCHLOROTHIAZIDE 25 MG PO TABS: 25.0000 mg | ORAL_TABLET | Freq: Every day | ORAL | 0 refills | Status: DC

## 2017-10-27 MED ORDER — HYDROMORPHONE HCL 1 MG/ML IJ SOLN
1.0000 mg | Freq: Once | INTRAMUSCULAR | Status: AC
Start: 2017-10-27 — End: 2017-10-27
  Administered 2017-10-27: 1 mg via INTRAVENOUS
  Filled 2017-10-27: qty 1

## 2017-10-27 MED ORDER — MORPHINE SULFATE (PF) 4 MG/ML IV SOLN
4.0000 mg | Freq: Once | INTRAVENOUS | Status: AC
  Administered 2017-10-27: 4 mg via INTRAVENOUS
  Filled 2017-10-27: qty 1

## 2017-10-27 NOTE — ED Notes (Signed)
Patient transported to CT 

## 2017-10-27 NOTE — ED Triage Notes (Signed)
Patient complains of severe occipital headache with right eye blurriness x 1 day. Denies trauma, reports no relief with otc meds. States that she has numbness to right hand. Speech clear, no nausea

## 2017-10-27 NOTE — ED Provider Notes (Signed)
[facility] Provider Note   CSN: [id number] Arrival date & time: 10/27/17  0915     History   Chief Complaint No chief complaint on file.   HPI Krista Rodgers is a 27 y.o. female.  Pt presents to the ED today with severe headache and right eye blurry vision.  Pt said sx started last night, but woke her up from sleep today at 0400.  The pt does not have a hx of migraines.  She denies any trauma/fever.  BP elevated in triage.  Pt does not take meds for bp.      Past Medical History:  Diagnosis Date  . Hyperlipidemia     Patient Active Problem List   Diagnosis Date Noted  . Essential hypertension 11/29/2014    History reviewed. No pertinent surgical history.  OB History    No data available       Home Medications    Prior to Admission medications   Medication Sig Start Date End Date Taking? Authorizing Provider  hydrochlorothiazide (HYDRODIURIL) 25 MG tablet Take 1 tablet (25 mg total) by mouth daily. 10/27/17   Jacalyn LefevreHaviland, Thurston Brendlinger, MD  ibuprofen (ADVIL,MOTRIN) 200 MG tablet Take 600 mg by mouth daily as needed (pain).    [provider]  ondansetron (ZOFRAN ODT) 4 MG disintegrating tablet Take 1 tablet (4 mg total) by mouth every 8 (eight) hours as needed. 10/27/17   Jacalyn LefevreHaviland, Archer Moist, MD  traMADol (ULTRAM) 50 MG tablet Take 1 tablet (50 mg total) by mouth every 6 (six) hours as needed. Patient not taking: Reported on 10/27/2017 07/16/17   Charlynne PanderYao, David Hsienta, MD    Family History History reviewed. No pertinent family history.  Social History Social History   Tobacco Use  . Smoking status: Never Smoker  . Smokeless tobacco: Never Used  Substance Use Topics  . Alcohol use: No  . Drug use: No     Allergies   Toradol [ketorolac tromethamine]; Amoxicillin; and Peach [prunus persica]   Review of Systems Review of Systems  Eyes: Positive for visual disturbance.  Neurological: Positive for headaches.  All other  systems reviewed and are negative.    Physical Exam Updated Vital Signs BP (!) 164/107   Pulse 97   Temp 99 F (37.2 C) (Oral)   Resp 16   Ht 5\' 1"  (1.549 m)   Wt 63.5 kg (140 lb)   LMP 10/01/2017 (Approximate)   SpO2 100%   BMI 26.45 kg/m   Physical Exam  Constitutional: She is oriented to person, place, and time. She appears well-developed and well-nourished.  HENT:  Head: Normocephalic and atraumatic.  Right Ear: External ear normal.  Left Ear: External ear normal.  Nose: Nose normal.  Mouth/Throat: Oropharynx is clear and moist.  Eyes: Conjunctivae and EOM are normal. Pupils are equal, round, and reactive to light.  Neck: Normal range of motion. Neck supple.  Cardiovascular: Normal rate, regular rhythm, normal heart sounds and intact distal pulses.  Pulmonary/Chest: Effort normal and breath sounds normal.  Abdominal: Soft. Bowel sounds are normal.  Musculoskeletal: Normal range of motion.  Neurological: She is alert and oriented to person, place, and time.  Skin: Skin is warm. Capillary refill takes less than 2 seconds.  Psychiatric: She has a normal mood and affect. Her behavior is normal. Judgment and thought content normal.  Nursing note and vitals reviewed.    ED Treatments / Results  Labs (all labs ordered are listed, but only abnormal results are displayed) Labs  Reviewed  BASIC METABOLIC PANEL - Abnormal; Notable for the following components:      Result Value   Calcium 8.7 (*)    All other components within normal limits  CBC - Abnormal; Notable for the following components:   Hemoglobin 11.9 (*)    All other components within normal limits  I-STAT BETA HCG BLOOD, ED ([facility], [facility], [facility] ONLY)    EKG  EKG Interpretation None       Radiology Ct Head Wo Contrast  Result Date: 10/27/2017 CLINICAL DATA:  Severe left occipital headache. EXAM: CT HEAD WITHOUT CONTRAST TECHNIQUE: Contiguous axial images were obtained from the base of the skull through the  vertex without intravenous contrast. COMPARISON:  None. FINDINGS: Brain: No acute intracranial abnormality. Specifically, no hemorrhage, hydrocephalus, mass lesion, acute infarction, or significant intracranial injury. Vascular: No hyperdense vessel or unexpected calcification. Skull: No acute calvarial abnormality. Sinuses/Orbits: Visualized paranasal sinuses and mastoids clear. Orbital soft tissues unremarkable. Other: None IMPRESSION: Normal study. Electronically Signed   By: Charlett Nose M.D.   On: 10/27/2017 11:06    Procedures Procedures (including critical care time)  Medications Ordered in ED Medications  sodium chloride 0.9 % bolus 1,000 mL (0 mLs Intravenous Stopped 10/27/17 1235)    And  0.9 %  sodium chloride infusion ( Intravenous New Bag/Given 10/27/17 1235)  morphine 4 MG/ML injection 4 mg (4 mg Intravenous Given 10/27/17 1040)  ondansetron (ZOFRAN) injection 4 mg (4 mg Intravenous Given 10/27/17 1040)  promethazine (PHENERGAN) injection 25 mg (25 mg Intravenous Given 10/27/17 1226)  HYDROmorphone (DILAUDID) injection 1 mg (1 mg Intravenous Given 10/27/17 1226)     Initial Impression / Assessment and Plan / ED Course  I have reviewed the triage vital signs and the nursing notes.  Pertinent labs & imaging results that were available during my care of the patient were reviewed by me and considered in my medical decision making (see chart for details).  Pt is feeling much better.  She does have a hx of htn, but has never been on meds.  I will start her on HCTZ to see if that helps.  Pt is encouraged to establish pcp.  She knows to return if worse.  Final Clinical Impressions(s) / ED Diagnoses   Final diagnoses:  Acute nonintractable headache, unspecified headache type  Essential hypertension    ED Discharge Orders        Ordered    hydrochlorothiazide (HYDRODIURIL) 25 MG tablet  Daily     10/27/17 1309    ondansetron (ZOFRAN ODT) 4 MG disintegrating tablet  Every 8 hours PRN      10/27/17 1309       Jacalyn Lefevre, MD 10/27/17 1311

## 2017-11-25 ENCOUNTER — Emergency Department (HOSPITAL_COMMUNITY)
Admission: EM | Admit: 2017-11-25 | Discharge: 2017-11-25 | Disposition: A | Discharge status: Home / Self Care | Payer: 59 | Attending: Emergency Medicine | Admitting: Emergency Medicine

## 2017-11-25 ENCOUNTER — Encounter (HOSPITAL_COMMUNITY): Payer: Self-pay | Admitting: *Deleted

## 2017-11-25 ENCOUNTER — Other Ambulatory Visit: Payer: Self-pay

## 2017-11-25 ENCOUNTER — Emergency Department (HOSPITAL_COMMUNITY): Payer: 59

## 2017-11-25 DIAGNOSIS — I10 Essential (primary) hypertension: Secondary | ICD-10-CM | POA: Diagnosis not present

## 2017-11-25 DIAGNOSIS — R109 Unspecified abdominal pain: Secondary | ICD-10-CM | POA: Diagnosis present

## 2017-11-25 DIAGNOSIS — R1031 Right lower quadrant pain: Secondary | ICD-10-CM | POA: Insufficient documentation

## 2017-11-25 DIAGNOSIS — Z3201 Encounter for pregnancy test, result positive: Secondary | ICD-10-CM

## 2017-11-25 DIAGNOSIS — Z79899 Other long term (current) drug therapy: Secondary | ICD-10-CM | POA: Insufficient documentation

## 2017-11-25 LAB — COMPREHENSIVE METABOLIC PANEL
ALBUMIN: 3.8 g/dL (ref 3.5–5.0)
ALT: 9 U/L — AB (ref 14–54)
AST: 17 U/L (ref 15–41)
Alkaline Phosphatase: 49 U/L (ref 38–126)
Anion gap: 9 (ref 5–15)
BUN: 7 mg/dL (ref 6–20)
CO2: 20 mmol/L — AB (ref 22–32)
Calcium: 9 mg/dL (ref 8.9–10.3)
Chloride: 107 mmol/L (ref 101–111)
Creatinine, Ser: 0.69 mg/dL (ref 0.44–1.00)
GFR calc Af Amer: >60 mL/min (ref >60)
Glucose, Bld: 98 mg/dL (ref 65–99)
POTASSIUM: 3.8 mmol/L (ref 3.5–5.1)
SODIUM: 136 mmol/L (ref 135–145)
Total Bilirubin: 0.7 mg/dL (ref 0.3–1.2)
Total Protein: 6.9 g/dL (ref 6.5–8.1)

## 2017-11-25 LAB — I-STAT BETA HCG BLOOD, ED (MC, WL, AP ONLY): HCG, QUANTITATIVE: 76.2 m[IU]/mL — AB (ref <5)

## 2017-11-25 LAB — CBC
HEMATOCRIT: 39.4 % (ref 36.0–46.0)
Hemoglobin: 12.5 g/dL (ref 12.0–15.0)
MCH: 26.4 pg (ref 26.0–34.0)
MCHC: 31.7 g/dL (ref 30.0–36.0)
MCV: 83.1 fL (ref 78.0–100.0)
Platelets: 259 10*3/uL (ref 150–400)
RBC: 4.74 MIL/uL (ref 3.87–5.11)
RDW: 13.1 % (ref 11.5–15.5)
WBC: 7.8 10*3/uL (ref 4.0–10.5)

## 2017-11-25 LAB — URINALYSIS, ROUTINE W REFLEX MICROSCOPIC
BACTERIA UA: NONE SEEN
BILIRUBIN URINE: NEGATIVE
Glucose, UA: NEGATIVE mg/dL
Hgb urine dipstick: NEGATIVE
KETONES UR: NEGATIVE mg/dL
LEUKOCYTES UA: NEGATIVE
Nitrite: NEGATIVE
Protein, ur: NEGATIVE mg/dL
Specific Gravity, Urine: 1.018 (ref 1.005–1.030)
pH: 7 (ref 5.0–8.0)

## 2017-11-25 LAB — LIPASE, BLOOD: LIPASE: 31 U/L (ref 11–51)

## 2017-11-25 LAB — HCG, QUANTITATIVE, PREGNANCY: hCG, Beta Chain, Quant, S: 85 m[IU]/mL — ABNORMAL HIGH (ref <5)

## 2017-11-25 MED ORDER — PRENATAL COMPLETE 14-0.4 MG PO TABS: 1.0000 | ORAL_TABLET | Freq: Every day | ORAL | 0 refills | Status: DC

## 2017-11-25 NOTE — ED Notes (Signed)
abd pain for 5 days with n v and diarrhea lmp jan 22

## 2017-11-25 NOTE — ED Provider Notes (Signed)
[facility] Provider Note   CSN: [id number] Arrival date & time: 11/25/17  1114     History   Chief Complaint Chief Complaint  Patient presents with  . Abdominal Pain  . Emesis    HPI Krista Rodgers is a 27 y.o. female.  The history is provided by the patient and medical records. No language interpreter was used.  Abdominal Pain   Associated symptoms include diarrhea, nausea and vomiting. Pertinent negatives include fever and constipation.  Emesis   Associated symptoms include abdominal pain and diarrhea. Pertinent negatives include no chills, no cough and no fever.   Krista Rodgers is a 27 y.o. female  with a PMH of HTN, HLD who presents to the Emergency Department complaining of crampy right lower quadrant abdominal pain which began about 4 days ago.  Patient states that the pain is constant, but will wax and wane in intensity throughout the day.  Associated with nausea, 4 episodes of vomiting.  Not currently nauseous.  She did start having loose stools today.  Last menstrual period was around January 22.  She is sexually active.  She has not had any vaginal discharge or bleeding.  No dysuria, urinary urgency or frequency.  No back pain.  No medications taken prior to arrival for symptoms.  She does report having one miscarriage in the past.  No other pregnancies.  Past Medical History:  Diagnosis Date  . Hyperlipidemia   . Hypertension     Patient Active Problem List   Diagnosis Date Noted  . Essential hypertension 11/29/2014    History reviewed. No pertinent surgical history.  OB History    No data available       Home Medications    Prior to Admission medications   Medication Sig Start Date End Date Taking? Authorizing Provider  medroxyPROGESTERone (DEPO-PROVERA) 150 MG/ML injection Inject 150 mg into the muscle every 3 (three) months.   Yes [provider]  hydrochlorothiazide (HYDRODIURIL) 25 MG tablet Take 1  tablet (25 mg total) by mouth daily. Patient not taking: Reported on 11/25/2017 10/27/17   Jacalyn LefevreHaviland, Julie, MD  ondansetron (ZOFRAN ODT) 4 MG disintegrating tablet Take 1 tablet (4 mg total) by mouth every 8 (eight) hours as needed. Patient not taking: Reported on 11/25/2017 10/27/17   Jacalyn LefevreHaviland, Julie, MD  Prenatal Vit-Fe Fumarate-FA (PRENATAL COMPLETE) 14-0.4 MG TABS Take 1 tablet by mouth daily. 11/25/17   Ward, Chase PicketJaime Pilcher, PA-C  traMADol (ULTRAM) 50 MG tablet Take 1 tablet (50 mg total) by mouth every 6 (six) hours as needed. Patient not taking: Reported on 10/27/2017 07/16/17   Charlynne PanderYao, David Hsienta, MD    Family History History reviewed. No pertinent family history.  Social History Social History   Tobacco Use  . Smoking status: Never Smoker  . Smokeless tobacco: Never Used  Substance Use Topics  . Alcohol use: No  . Drug use: No     Allergies   Toradol [ketorolac tromethamine]; Amoxicillin; and Peach [prunus persica]   Review of Systems Review of Systems  Constitutional: Negative for chills and fever.  HENT: Negative for congestion.   Respiratory: Negative for cough.   Gastrointestinal: Positive for abdominal pain, diarrhea, nausea and vomiting. Negative for blood in stool and constipation.  All other systems reviewed and are negative.    Physical Exam Updated Vital Signs BP (!) 134/96 (BP Location: Right Arm)   Pulse 83   Temp 99.8 F (37.7 C) (Oral)   Resp 16   LMP  10/25/2017   SpO2 100%   Physical Exam  Constitutional: She is oriented to person, place, and time. She appears well-developed and well-nourished. No distress.  HENT:  Head: Normocephalic and atraumatic.  Cardiovascular: Normal rate, regular rhythm and normal heart sounds.  No murmur heard. Pulmonary/Chest: Effort normal and breath sounds normal. No respiratory distress.  Abdominal: Soft. Bowel sounds are normal. She exhibits no distension.  Tenderness to palpation of the right lower quadrant  without rebound or guarding.  No CVA tenderness.  Musculoskeletal: She exhibits no edema.  Neurological: She is alert and oriented to person, place, and time.  Skin: Skin is warm and dry.  Nursing note and vitals reviewed.    ED Treatments / Results  Labs (all labs ordered are listed, but only abnormal results are displayed) Labs Reviewed  COMPREHENSIVE METABOLIC PANEL - Abnormal; Notable for the following components:      Result Value   CO2 20 (*)    ALT 9 (*)    All other components within normal limits  URINALYSIS, ROUTINE W REFLEX MICROSCOPIC - Abnormal; Notable for the following components:   Squamous Epithelial / LPF 0-5 (*)    All other components within normal limits  HCG, QUANTITATIVE, PREGNANCY - Abnormal; Notable for the following components:   hCG, Beta Chain, Quant, S 85 (*)    All other components within normal limits  I-STAT BETA HCG BLOOD, ED ([facility], [facility], [facility] ONLY) - Abnormal; Notable for the following components:   I-stat hCG, quantitative 76.2 (*)    All other components within normal limits  LIPASE, BLOOD  CBC    EKG  EKG Interpretation None       Radiology US Ob Comp < 14 Wks  Result Date: 11/25/2017 CLINICAL DATA:  Pain and positive pregnancy test. Rule out ectopic pregnancy. Quantitative beta HCG of 85. EXAM: OBSTETRIC <14 WK [place] AND TRANSVAGINAL OB US TECHNIQUE: Both transabdominal and transvaginal ultrasound examinations were performed for complete evaluation of the gestation as well as the maternal uterus, adnexal regions, and pelvic cul-de-sac. Transvaginal technique was performed to assess early pregnancy. COMPARISON:  09/03/2017 abdominal CT FINDINGS: No intrauterine gestational sac is seen. There is a 1 cm right paraovarian cystic structure that has a thin wall along most of its contour and shows no visible vascularity-features which would be atypical for a tubal ectopic. Still, extra ovarian mass in a pregnant patient needs close follow-up to exclude  ectopic. On the left is a intra-ovarian 3.5 cm cystic lesion with internal septation and lace-like appearance that is nonvascular. There is no left ovarian mass on recent abdominal CT and findings consistent with a hemorrhagic cyst. No free pelvic fluid.  Normal appearance of the uterus. These results were called by telephone at the time of interpretation on 11/25/2017 at 3:43 pm to Dr. Fayrene Fearing, who verbally acknowledged these results. IMPRESSION: 1. Pregnancy of unknown location. There is a right extra ovarian adnexal cyst measuring 1 cm which needs close follow-up to exclude ectopic pregnancy. 2. 3.5 cm hemorrhagic cyst in the left ovary. Electronically Signed   By: Marnee Spring M.D.   On: 11/25/2017 15:46   US Ob Transvaginal  Result Date: 11/25/2017 CLINICAL DATA:  Pain and positive pregnancy test. Rule out ectopic pregnancy. Quantitative beta HCG of 85. EXAM: OBSTETRIC <14 WK [place] AND TRANSVAGINAL OB US TECHNIQUE: Both transabdominal and transvaginal ultrasound examinations were performed for complete evaluation of the gestation as well as the maternal uterus, adnexal regions, and pelvic cul-de-sac. Transvaginal technique was  performed to assess early pregnancy. COMPARISON:  09/03/2017 abdominal CT FINDINGS: No intrauterine gestational sac is seen. There is a 1 cm right paraovarian cystic structure that has a thin wall along most of its contour and shows no visible vascularity-features which would be atypical for a tubal ectopic. Still, extra ovarian mass in a pregnant patient needs close follow-up to exclude ectopic. On the left is a intra-ovarian 3.5 cm cystic lesion with internal septation and lace-like appearance that is nonvascular. There is no left ovarian mass on recent abdominal CT and findings consistent with a hemorrhagic cyst. No free pelvic fluid.  Normal appearance of the uterus. These results were called by telephone at the time of interpretation on 11/25/2017 at 3:43 pm to Dr. Fayrene Fearing, who  verbally acknowledged these results. IMPRESSION: 1. Pregnancy of unknown location. There is a right extra ovarian adnexal cyst measuring 1 cm which needs close follow-up to exclude ectopic pregnancy. 2. 3.5 cm hemorrhagic cyst in the left ovary. Electronically Signed   By: Marnee Spring M.D.   On: 11/25/2017 15:46    Procedures Procedures (including critical care time)  Medications Ordered in ED Medications - No data to display   Initial Impression / Assessment and Plan / ED Course  I have reviewed the triage vital signs and the nursing notes.  Pertinent labs & imaging results that were available during my care of the patient were reviewed by me and considered in my medical decision making (see chart for details).    Krista Rodgers is a 27 y.o. female who presents to ED for waxing-waning, crampy RLQ abdominal pain x 4 days.  On exam, patient is afebrile, hemodynamically stable with tenderness to the right lower quadrant.  No peritoneal signs.  Quantitative hCG of 85.  Labs otherwise reassuring.  UA without blood or signs of infection.  She is not having any vaginal discharge or bleeding.  Ultrasound obtained showing pregnancy of unknown location.  There is right extra ovarian adnexal cyst measuring around 1 cm which could potentially be an ectopic pregnancy.   4:06 PM - Discussed case with OBGYN, Dr. Ashok Pall, who recommends starting prenatal vitamin and close outpatient follow up. I appreciate OBGYN assistance with patient care today. Dr. Ashok Pall has scheduled for a follow up appointment at the clinic 11:00 am on Friday.   I have discussed strict return precautions with patient at length. Follow up plan of care discussed as well. All questions answered.  Patient discussed with Dr. Fayrene Fearing who agrees with treatment plan.    Final Clinical Impressions(s) / ED Diagnoses   Final diagnoses:  Positive pregnancy test  RLQ abdominal pain    ED Discharge Orders        Ordered    Prenatal Vit-Fe  Fumarate-FA (PRENATAL COMPLETE) 14-0.4 MG TABS  Daily     11/25/17 1637       Ward, Chase Picket, PA-C 11/25/17 1642    Rolland Porter, MD 11/25/17 2250

## 2017-11-25 NOTE — Discharge Instructions (Signed)
It was my pleasure taking care of you today!  [facility] 11 AM this coming Friday to repeat your blood work.   Start taking pre-natal vitamin daily.   [facility] for vaginal bleeding, worsening pain, new symptoms or any additional concerns.

## 2017-11-25 NOTE — ED Triage Notes (Signed)
Pt reports having cramping right side lower abd pain x 2 days with n/v/d. No acute distress is noted at triage.

## 2017-11-27 ENCOUNTER — Ambulatory Visit: Payer: 59 | Admitting: *Deleted

## 2017-11-27 ENCOUNTER — Encounter: Payer: Self-pay | Admitting: *Deleted

## 2017-11-27 DIAGNOSIS — O3680X0 Pregnancy with inconclusive fetal viability, not applicable or unspecified: Secondary | ICD-10-CM

## 2017-11-27 LAB — HCG, QUANTITATIVE, PREGNANCY: hCG, Beta Chain, Quant, S: 267 m[IU]/mL — ABNORMAL HIGH (ref <5)

## 2017-11-27 NOTE — Progress Notes (Signed)
Here for stat bhcg. Denies any bleeding but c/o same cramping pain =6-7 more on right side. Explained will draw stat bhcg and have her wait in lobby for results; then will review results with provider and her. Explained will be about 1.5 hours. She voices understanding.   Reviewed results with Steward DroneVeronica Rogers, CNM and with patient. Informed her she had an appropriate rise in her bhcg but because of us results still concern for ectopic pregnancy.  Plan is to repeat non stat bhcg in one week and ultrasound on 12/04/17. Will come to office for us results. Strict ectopic precautions given. 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  Derica voices understanding.

## 2017-11-27 NOTE — Progress Notes (Signed)
I have reviewed the chart and agree with nursing staff's documentation of this patient's encounter.  Sharyon CableVeronica C Karelyn Brisby, CNM 11/27/2017 1:55 PM

## 2017-11-30 ENCOUNTER — Ambulatory Visit: Payer: 59

## 2017-11-30 ENCOUNTER — Other Ambulatory Visit: Payer: Self-pay | Admitting: *Deleted

## 2017-11-30 DIAGNOSIS — O3680X0 Pregnancy with inconclusive fetal viability, not applicable or unspecified: Secondary | ICD-10-CM

## 2017-12-04 ENCOUNTER — Other Ambulatory Visit: Payer: 59

## 2017-12-04 ENCOUNTER — Ambulatory Visit: Payer: 59 | Admitting: General Practice

## 2017-12-04 ENCOUNTER — Ambulatory Visit (HOSPITAL_COMMUNITY)
Admission: RE | Admit: 2017-12-04 | Discharge: 2017-12-04 | Disposition: A | Discharge status: Home / Self Care | Payer: 59 | Source: Ambulatory Visit | Attending: Certified Nurse Midwife | Admitting: Certified Nurse Midwife

## 2017-12-04 DIAGNOSIS — N83292 Other ovarian cyst, left side: Secondary | ICD-10-CM | POA: Diagnosis not present

## 2017-12-04 DIAGNOSIS — O3680X1 Pregnancy with inconclusive fetal viability, fetus 1: Secondary | ICD-10-CM

## 2017-12-04 DIAGNOSIS — Z3490 Encounter for supervision of normal pregnancy, unspecified, unspecified trimester: Secondary | ICD-10-CM | POA: Diagnosis present

## 2017-12-04 DIAGNOSIS — O3680X0 Pregnancy with inconclusive fetal viability, not applicable or unspecified: Secondary | ICD-10-CM

## 2017-12-04 DIAGNOSIS — Z712 Person consulting for explanation of examination or test findings: Secondary | ICD-10-CM

## 2017-12-04 DIAGNOSIS — Z3A01 Less than 8 weeks gestation of pregnancy: Secondary | ICD-10-CM | POA: Diagnosis not present

## 2017-12-04 DIAGNOSIS — O3481 Maternal care for other abnormalities of pelvic organs, first trimester: Secondary | ICD-10-CM | POA: Diagnosis not present

## 2017-12-04 MED ORDER — PRENATAL VITAMIN PLUS LOW IRON 27-1 MG PO TABS: 1.0000 | ORAL_TABLET | Freq: Every day | ORAL | 2 refills | Status: AC

## 2017-12-04 NOTE — Progress Notes (Signed)
I have reviewed this chart and agree with the RN/CMA assessment and management.    K. Meryl Davis, M.D. Attending Obstetrician & Gynecologist, [facility], [facility]  

## 2017-12-04 NOTE — Progress Notes (Signed)
Patient here for viability results today. Reviewed with Dr Earlene Plateravis who finds progression of pregnancy compared to last ultrasound with fetal pole seen- no longer concern for ectopic pregnancy. Patient should have follow up viability scan in 2 weeks. Scheduled for 3/22.   Informed patient of results & follow up ultrasound appt. Patient verbalized understanding & requests PNV Rx. Patient had no questions

## 2017-12-05 LAB — BETA HCG QUANT (REF LAB): hCG Quant: 4794 m[IU]/mL

## 2017-12-08 ENCOUNTER — Encounter (HOSPITAL_COMMUNITY): Payer: Self-pay

## 2017-12-08 ENCOUNTER — Inpatient Hospital Stay (HOSPITAL_COMMUNITY)
Admission: AD | Admit: 2017-12-08 | Discharge: 2017-12-08 | Disposition: A | Discharge status: Home / Self Care | Payer: 59 | Source: Ambulatory Visit | Attending: Obstetrics and Gynecology | Admitting: Obstetrics and Gynecology

## 2017-12-08 ENCOUNTER — Other Ambulatory Visit: Payer: Self-pay

## 2017-12-08 DIAGNOSIS — Z91018 Allergy to other foods: Secondary | ICD-10-CM | POA: Diagnosis not present

## 2017-12-08 DIAGNOSIS — O26851 Spotting complicating pregnancy, first trimester: Secondary | ICD-10-CM | POA: Insufficient documentation

## 2017-12-08 DIAGNOSIS — Z3A01 Less than 8 weeks gestation of pregnancy: Secondary | ICD-10-CM | POA: Diagnosis not present

## 2017-12-08 DIAGNOSIS — Z888 Allergy status to other drugs, medicaments and biological substances status: Secondary | ICD-10-CM | POA: Diagnosis not present

## 2017-12-08 DIAGNOSIS — N939 Abnormal uterine and vaginal bleeding, unspecified: Secondary | ICD-10-CM | POA: Diagnosis present

## 2017-12-08 DIAGNOSIS — I10 Essential (primary) hypertension: Secondary | ICD-10-CM | POA: Diagnosis not present

## 2017-12-08 DIAGNOSIS — O209 Hemorrhage in early pregnancy, unspecified: Secondary | ICD-10-CM | POA: Diagnosis not present

## 2017-12-08 DIAGNOSIS — Z880 Allergy status to penicillin: Secondary | ICD-10-CM | POA: Insufficient documentation

## 2017-12-08 DIAGNOSIS — N760 Acute vaginitis: Secondary | ICD-10-CM | POA: Diagnosis not present

## 2017-12-08 DIAGNOSIS — B9689 Other specified bacterial agents as the cause of diseases classified elsewhere: Secondary | ICD-10-CM | POA: Diagnosis not present

## 2017-12-08 LAB — CBC
HEMATOCRIT: 36 % (ref 36.0–46.0)
Hemoglobin: 11.9 g/dL — ABNORMAL LOW (ref 12.0–15.0)
MCH: 26.9 pg (ref 26.0–34.0)
MCHC: 33.1 g/dL (ref 30.0–36.0)
MCV: 81.3 fL (ref 78.0–100.0)
Platelets: 221 10*3/uL (ref 150–400)
RBC: 4.43 MIL/uL (ref 3.87–5.11)
RDW: 13.2 % (ref 11.5–15.5)
WBC: 7.9 10*3/uL (ref 4.0–10.5)

## 2017-12-08 LAB — WET PREP, GENITAL
SPERM: NONE SEEN
TRICH WET PREP: NONE SEEN
YEAST WET PREP: NONE SEEN

## 2017-12-08 MED ORDER — METRONIDAZOLE 500 MG PO TABS: 500.0000 mg | ORAL_TABLET | Freq: Two times a day (BID) | ORAL | 0 refills | Status: DC

## 2017-12-08 NOTE — Discharge Instructions (Signed)
Vaginal Bleeding During Pregnancy, First Trimester A small amount of bleeding (spotting) from the vagina is common in early pregnancy. Sometimes the bleeding is normal and is not a problem, and sometimes it is a sign of something serious. Be sure to tell your doctor about any bleeding from your vagina right away. Follow these instructions at home:  Watch your condition for any changes.  Follow your doctor's instructions about how active you can be.  If you are on bed rest: ? You may need to stay in bed and only get up to use the bathroom. ? You may be allowed to do some activities. ? If you need help, make plans for someone to help you.  Write down: ? The number of pads you use each day. ? How often you change pads. ? How soaked (saturated) your pads are.  Do not use tampons.  Do not douche.  Do not have sex or orgasms until your doctor says it is okay.  If you pass any tissue from your vagina, save the tissue so you can show it to your doctor.  Only take medicines as told by your doctor.  Do not take aspirin because it can make you bleed.  Keep all follow-up visits as told by your doctor. Contact a doctor if:  You bleed from your vagina.  You have cramps.  You have labor pains.  You have a fever that does not go away after you take medicine. Get help right away if:  You have very bad cramps in your back or belly (abdomen).  You pass large clots or tissue from your vagina.  You bleed more.  You feel light-headed or weak.  You pass out (faint).  You have chills.  You are leaking fluid or have a gush of fluid from your vagina.  You pass out while pooping (having a bowel movement). This information is not intended to replace advice given to you by your health care provider. Make sure you discuss any questions you have with your health care provider. Document Released: 02/06/2014 Document Revised: 02/28/2016 Document Reviewed: 05/30/2013 Elsevier Interactive  Patient Education  2018 [employer].  Bacterial Vaginosis Bacterial vaginosis is a vaginal infection that occurs when the normal balance of bacteria in the vagina is disrupted. It results from an overgrowth of certain bacteria. This is the most common vaginal infection among women ages 21-44. Because bacterial vaginosis increases your risk for STIs (sexually transmitted infections), getting treated can help reduce your risk for chlamydia, gonorrhea, herpes, and HIV (human immunodeficiency virus). Treatment is also important for preventing complications in pregnant women, because this condition can cause an early (premature) delivery. What are the causes? This condition is caused by an increase in harmful bacteria that are normally present in small amounts in the vagina. However, the reason that the condition develops is not fully understood. What increases the risk? The following factors may make you more likely to develop this condition:  Having a new sexual partner or multiple sexual partners.  Having unprotected sex.  Douching.  Having an intrauterine device (IUD).  Smoking.  Drug and alcohol abuse.  Taking certain antibiotic medicines.  Being pregnant.  You cannot get bacterial vaginosis from toilet seats, bedding, swimming pools, or contact with objects around you. What are the signs or symptoms? Symptoms of this condition include:  Grey or white vaginal discharge. The discharge can also be watery or foamy.  A fish-like odor with discharge, especially after sexual intercourse or during menstruation.  Itching in and around the vagina.  Burning or pain with urination.  Some women with bacterial vaginosis have no signs or symptoms. How is this diagnosed? This condition is diagnosed based on:  Your medical history.  A physical exam of the vagina.  Testing a sample of vaginal fluid under a microscope to look for a large amount of bad bacteria or abnormal cells. Your  health care provider may use a cotton swab or a small wooden spatula to collect the sample.  How is this treated? This condition is treated with antibiotics. These may be given as a pill, a vaginal cream, or a medicine that is put into the vagina (suppository). If the condition comes back after treatment, a second round of antibiotics may be needed. Follow these instructions at home: Medicines  Take over-the-counter and prescription medicines only as told by your health care provider.  Take or use your antibiotic as told by your health care provider. Do not stop taking or using the antibiotic even if you start to feel better. General instructions  If you have a female sexual partner, tell her that you have a vaginal infection. She should see her health care provider and be treated if she has symptoms. If you have a female sexual partner, he does not need treatment.  During treatment: ? Avoid sexual activity until you finish treatment. ? Do not douche. ? Avoid alcohol as directed by your health care provider. ? Avoid breastfeeding as directed by your health care provider.  Drink enough water and fluids to keep your urine clear or pale yellow.  Keep the area around your vagina and rectum clean. ? Wash the area daily with warm water. ? Wipe yourself from front to back after using the toilet.  Keep all follow-up visits as told by your health care provider. This is important. How is this prevented?  Do not douche.  Wash the outside of your vagina with warm water only.  Use protection when having sex. This includes latex condoms and dental dams.  Limit how many sexual partners you have. To help prevent bacterial vaginosis, it is best to have sex with just one partner (monogamous).  Make sure you and your sexual partner are tested for STIs.  Wear cotton or cotton-lined underwear.  Avoid wearing tight pants and pantyhose, especially during summer.  Limit the amount of alcohol that  you drink.  Do not use any products that contain nicotine or tobacco, such as cigarettes and e-cigarettes. If you need help quitting, ask your health care provider.  Do not use illegal drugs. Where to find more information:  Centers for Disease Control and Prevention: SolutionApps.co.za  American Sexual Health Association (ASHA): www.ashastd.org  U.S. Department of Health and Health and safety inspector, Office on Women's Health: ConventionalMedicines.si or http://www.anderson-williamson.info/ Contact a health care provider if:  Your symptoms do not improve, even after treatment.  You have more discharge or pain when urinating.  You have a fever.  You have pain in your abdomen.  You have pain during sex.  You have vaginal bleeding between periods. Summary  Bacterial vaginosis is a vaginal infection that occurs when the normal balance of bacteria in the vagina is disrupted.  Because bacterial vaginosis increases your risk for STIs (sexually transmitted infections), getting treated can help reduce your risk for chlamydia, gonorrhea, herpes, and HIV (human immunodeficiency virus). Treatment is also important for preventing complications in pregnant women, because the condition can cause an early (premature) delivery.  This condition is treated with  antibiotic medicines. These may be given as a pill, a vaginal cream, or a medicine that is put into the vagina (suppository). This information is not intended to replace advice given to you by your health care provider. Make sure you discuss any questions you have with your health care provider. Document Released: 09/22/2005 Document Revised: 01/26/2017 Document Reviewed: 06/07/2016 Elsevier Interactive Patient Education  [employer].

## 2017-12-08 NOTE — MAU Provider Note (Signed)
History    First Provider Initiated Contact with Patient 12/08/17 1730      Chief Complaint:  Vaginal Bleeding   Krista Rodgers is  27 y.o. G3P0020 Patient's last menstrual period was 11/01/2017 (exact date).. Patient is here for spotting. She is [redacted]w[redacted]d weeks gestation  by LMP, early ultrasound. [place] 12/04/17 showed pos YS. No FP. Pt has Hx SAB x 2. States she is very nervous that she may have another miscarriage.   Since her last visit, the patient is with new complaint.     ROS Abdominal Pain: Denies Vaginal bleeding: spotting.   Passage of clots or tissue: Denies Dizziness: Denies  A POS   Physical Exam   Patient Vitals for the past 24 hrs:  BP Temp Temp src Pulse Resp SpO2 Weight  12/08/17 1549 (!) 140/95 98.5 F (36.9 C) Oral 87 16 99 % -  12/08/17 1540 - - - - - - 146 lb 4 oz (66.3 kg)   Constitutional: Well-nourished female in no apparent distress. No pallor Neuro: Alert and oriented 4 Cardiovascular: Normal rate Respiratory: Normal effort and rate Abdomen: Soft, nontender Gynecological Exam: normal external genitalia, vulva, vagina, cervix, uterus and adnexa. Scant creamy pink, malodorous discharge. Cervix closed. Uterus slightly enlarged. No CMT   Labs: Results for orders placed or performed during the hospital encounter of 12/08/17 (from the past 24 hour(s))  CBC   Collection Time: 12/08/17  4:02 PM  Result Value Ref Range   WBC 7.9 4.0 - 10.5 K/uL   RBC 4.43 3.87 - 5.11 MIL/uL   Hemoglobin 11.9 (L) 12.0 - 15.0 g/dL   HCT 16.1 09.6 - 04.5 %   MCV 81.3 78.0 - 100.0 fL   MCH 26.9 26.0 - 34.0 pg   MCHC 33.1 30.0 - 36.0 g/dL   RDW 40.9 81.1 - 91.4 %   Platelets 221 150 - 400 K/uL  Wet prep, genital   Collection Time: 12/08/17  4:16 PM  Result Value Ref Range   Yeast Wet Prep HPF POC NONE SEEN NONE SEEN   Trich, Wet Prep NONE SEEN NONE SEEN   Clue Cells Wet Prep HPF POC PRESENT (A) NONE SEEN   WBC, Wet Prep HPF POC FEW (A) NONE SEEN   Sperm NONE SEEN      Ultrasound Studies:   NA  MAU course/MDM: Orders Placed This Encounter  Procedures  . Wet prep, genital  . CBC  . Discharge patient   Meds ordered this encounter  Medications  . metroNIDAZOLE (FLAGYL) 500 MG tablet    Sig: Take 1 tablet (500 mg total) by mouth 2 (two) times daily.    Dispense:  14 tablet    Refill:  0    Order Specific Question:   Supervising Provider    Answer:   [place] [[id number]]   Scant VB not highly concerning for SAB--possibly from BV. Too early to repeat US. Instructed to keep previously scheduled viability [place] appt. Hemodynamically stable.   Assessment: [redacted]w[redacted]d weeks gestation w/ spotting BV  Plan: Discharge home in stable condition. Bleeding precautions Follow-up Information    THE [facility] [place] ULTRASOUND Follow up on 12/25/2017.   Specialty:  Radiology Why:  for ultrasound Contact information: [address] [id number] mc [place] [address] [phone]       [facility] Follow up.   Specialty:  Obstetrics and Gynecology Why:  as needed if symptoms worsen Contact information: [address] [place] [address] [phone]  [facility] [place] [facility]ERN[facility] ADMISSIONS Follow up.   Why:  in emergencies Contact information: [address] [id number] [place] [id number] [phone]         Allergies as of 12/08/2017      Reactions   Toradol [ketorolac Tromethamine] Hives   Amoxicillin Hives   Has patient had a PCN reaction causing immediate rash, facial/tongue/throat swelling, SOB or lightheadedness with hypotension: Yes Has patient had a PCN reaction causing severe rash involving mucus membranes or skin necrosis: Unk Has patient had a PCN reaction that required hospitalization: Yes Has patient had a PCN reaction occurring within the last 10 years: Yes If all of the above answers are "NO", then may  proceed with Cephalosporin use.   Peach [prunus Persica] Hives      Medication List    STOP taking these medications   hydrochlorothiazide 25 MG tablet Commonly known as:  HYDRODIURIL   ondansetron 4 MG disintegrating tablet Commonly known as:  ZOFRAN ODT   traMADol 50 MG tablet Commonly known as:  ULTRAM     TAKE these medications   metroNIDAZOLE 500 MG tablet Commonly known as:  FLAGYL Take 1 tablet (500 mg total) by mouth 2 (two) times daily.   PRENATAL VITAMIN PLUS LOW IRON 27-1 MG Tabs Take 1 tablet by mouth daily.       Krista Rodgers, [place], CNM 12/08/2017, 6:13 PM  2/3

## 2017-12-08 NOTE — MAU Note (Signed)
Pt states that she saw brown discharge that looked like old blood yesterday morning. She then had cramping later in the evening and then saw dark red blood this morning when wiping.   She states she is having cramps now rating the pain 4/10 intermittent in her lower abdomen.

## 2017-12-09 ENCOUNTER — Telehealth: Payer: Self-pay | Admitting: *Deleted

## 2017-12-09 LAB — HIV ANTIBODY (ROUTINE TESTING W REFLEX): HIV Screen 4th Generation wRfx: NONREACTIVE

## 2017-12-09 LAB — GC/CHLAMYDIA PROBE AMP (~~LOC~~) NOT AT ARMC
Chlamydia: NEGATIVE
NEISSERIA GONORRHEA: NEGATIVE

## 2017-12-09 NOTE — Telephone Encounter (Signed)
Georgie called and left a voicemessage 12/07/17 5:54pm stating she is spotting and wants to speak with a nurse to know if she should come in.

## 2017-12-09 NOTE — Telephone Encounter (Signed)
Per chart review patient came into MAU for evaluation 12/08/17 pm .  I called Francisco and left a message I was returning her call and I see that she came it but was wanting to see how she was doing and if she has further questions to call us back .

## 2017-12-16 ENCOUNTER — Encounter (HOSPITAL_COMMUNITY): Payer: Self-pay | Admitting: *Deleted

## 2017-12-16 ENCOUNTER — Other Ambulatory Visit: Payer: Self-pay

## 2017-12-16 ENCOUNTER — Inpatient Hospital Stay (HOSPITAL_COMMUNITY)
Admission: AD | Admit: 2017-12-16 | Discharge: 2017-12-16 | Disposition: A | Discharge status: Home / Self Care | Payer: 59 | Source: Ambulatory Visit | Attending: Obstetrics and Gynecology | Admitting: Obstetrics and Gynecology

## 2017-12-16 ENCOUNTER — Inpatient Hospital Stay (HOSPITAL_COMMUNITY): Payer: 59

## 2017-12-16 DIAGNOSIS — O219 Vomiting of pregnancy, unspecified: Secondary | ICD-10-CM

## 2017-12-16 DIAGNOSIS — E785 Hyperlipidemia, unspecified: Secondary | ICD-10-CM | POA: Diagnosis not present

## 2017-12-16 DIAGNOSIS — O209 Hemorrhage in early pregnancy, unspecified: Secondary | ICD-10-CM | POA: Diagnosis not present

## 2017-12-16 DIAGNOSIS — Z79899 Other long term (current) drug therapy: Secondary | ICD-10-CM | POA: Insufficient documentation

## 2017-12-16 DIAGNOSIS — Z880 Allergy status to penicillin: Secondary | ICD-10-CM | POA: Insufficient documentation

## 2017-12-16 DIAGNOSIS — O3481 Maternal care for other abnormalities of pelvic organs, first trimester: Secondary | ICD-10-CM | POA: Insufficient documentation

## 2017-12-16 DIAGNOSIS — O161 Unspecified maternal hypertension, first trimester: Secondary | ICD-10-CM | POA: Insufficient documentation

## 2017-12-16 DIAGNOSIS — O99281 Endocrine, nutritional and metabolic diseases complicating pregnancy, first trimester: Secondary | ICD-10-CM | POA: Insufficient documentation

## 2017-12-16 DIAGNOSIS — Z3A01 Less than 8 weeks gestation of pregnancy: Secondary | ICD-10-CM | POA: Insufficient documentation

## 2017-12-16 DIAGNOSIS — N83209 Unspecified ovarian cyst, unspecified side: Secondary | ICD-10-CM

## 2017-12-16 DIAGNOSIS — O4691 Antepartum hemorrhage, unspecified, first trimester: Secondary | ICD-10-CM | POA: Diagnosis not present

## 2017-12-16 DIAGNOSIS — N83202 Unspecified ovarian cyst, left side: Secondary | ICD-10-CM | POA: Diagnosis not present

## 2017-12-16 LAB — CBC WITH DIFFERENTIAL/PLATELET
Basophils Absolute: 0 10*3/uL (ref 0.0–0.1)
Basophils Relative: 0 %
EOS ABS: 0.1 10*3/uL (ref 0.0–0.7)
EOS PCT: 1 %
HCT: 34.8 % — ABNORMAL LOW (ref 36.0–46.0)
Hemoglobin: 11.5 g/dL — ABNORMAL LOW (ref 12.0–15.0)
LYMPHS ABS: 2.5 10*3/uL (ref 0.7–4.0)
LYMPHS PCT: 25 %
MCH: 27 pg (ref 26.0–34.0)
MCHC: 33 g/dL (ref 30.0–36.0)
MCV: 81.7 fL (ref 78.0–100.0)
MONO ABS: 0.3 10*3/uL (ref 0.1–1.0)
Monocytes Relative: 3 %
Neutro Abs: 6.8 10*3/uL (ref 1.7–7.7)
Neutrophils Relative %: 71 %
PLATELETS: 228 10*3/uL (ref 150–400)
RBC: 4.26 MIL/uL (ref 3.87–5.11)
RDW: 13.1 % (ref 11.5–15.5)
WBC: 9.6 10*3/uL (ref 4.0–10.5)

## 2017-12-16 LAB — COMPREHENSIVE METABOLIC PANEL
ALT: 10 U/L — ABNORMAL LOW (ref 14–54)
ANION GAP: 6 (ref 5–15)
AST: 16 U/L (ref 15–41)
Albumin: 3.7 g/dL (ref 3.5–5.0)
Alkaline Phosphatase: 42 U/L (ref 38–126)
BUN: 11 mg/dL (ref 6–20)
CHLORIDE: 108 mmol/L (ref 101–111)
CO2: 22 mmol/L (ref 22–32)
Calcium: 8.6 mg/dL — ABNORMAL LOW (ref 8.9–10.3)
Creatinine, Ser: 0.55 mg/dL (ref 0.44–1.00)
Glucose, Bld: 84 mg/dL (ref 65–99)
POTASSIUM: 4 mmol/L (ref 3.5–5.1)
SODIUM: 136 mmol/L (ref 135–145)
Total Bilirubin: 0.1 mg/dL — ABNORMAL LOW (ref 0.3–1.2)
Total Protein: 7.1 g/dL (ref 6.5–8.1)

## 2017-12-16 LAB — URINALYSIS, ROUTINE W REFLEX MICROSCOPIC
BILIRUBIN URINE: NEGATIVE
GLUCOSE, UA: NEGATIVE mg/dL
HGB URINE DIPSTICK: NEGATIVE
Ketones, ur: NEGATIVE mg/dL
Leukocytes, UA: NEGATIVE
Nitrite: NEGATIVE
Protein, ur: NEGATIVE mg/dL
SPECIFIC GRAVITY, URINE: 1.023 (ref 1.005–1.030)
pH: 6 (ref 5.0–8.0)

## 2017-12-16 MED ORDER — PROMETHAZINE HCL 25 MG PO TABS: 25.0000 mg | ORAL_TABLET | Freq: Four times a day (QID) | ORAL | 2 refills | Status: DC | PRN

## 2017-12-16 MED ORDER — OXYCODONE-ACETAMINOPHEN 5-325 MG PO TABS: 1.0000 | ORAL_TABLET | Freq: Four times a day (QID) | ORAL | 0 refills | Status: DC | PRN

## 2017-12-16 MED ORDER — OXYCODONE-ACETAMINOPHEN 5-325 MG PO TABS
2.0000 | ORAL_TABLET | Freq: Once | ORAL | Status: AC
  Administered 2017-12-16: 2 via ORAL
  Filled 2017-12-16: qty 2

## 2017-12-16 MED ORDER — PROMETHAZINE HCL 25 MG/ML IJ SOLN
25.0000 mg | Freq: Once | INTRAMUSCULAR | Status: AC
  Administered 2017-12-16: 25 mg via INTRAMUSCULAR
  Filled 2017-12-16 (×2): qty 1

## 2017-12-16 NOTE — Discharge Instructions (Signed)
[facility]    [facility]  [phone]     Phone: [phone]          [facility]                      [facility]  @[place]     Phone: [phone]  Phone: [phone]         [facility]     Phone: [phone]  Phone: [phone]           [facility] [place]                hone: [phone]  Phone: [phone]         [facility]                       Phone: [phone]           [facility]                Phone: [phone]  [facility]   [phone]:[phone]    [facility] [address] [place]          Phone: [phone] [facility]   Phone: [phone]  Morning Sickness Morning sickness is when you feel sick to your stomach (nauseous) during pregnancy. This nauseous feeling may or may not come with vomiting. It often occurs in the morning but can be a problem any time of day. Morning sickness is most common during the first trimester, but it may continue throughout pregnancy. While morning sickness is unpleasant, it is usually harmless unless you develop severe and continual vomiting (hyperemesis gravidarum). This condition requires more intense treatment. What are the causes? The cause of morning sickness is not completely known but seems to be related to normal hormonal changes that occur in pregnancy. What increases the risk? You are at greater risk if you:  Experienced nausea or vomiting before your pregnancy.  Had morning sickness during a previous pregnancy.  Are pregnant with more than one baby, such as twins.  How is this treated? Do not use any medicines (prescription, over-the-counter, or herbal) for morning sickness without first talking to your health care provider. Your health care provider may prescribe or recommend:  Vitamin B6  supplements.  Anti-nausea medicines.  The herbal medicine ginger.  Follow these instructions at home:  Only take over-the-counter or prescription medicines as directed by your health care provider.  Taking multivitamins before getting pregnant can prevent or decrease the severity of morning sickness in most women.  Eat a piece of dry toast or unsalted crackers before getting out of bed in the morning.  Eat five or six small meals a day.  Eat dry and bland foods (rice, baked potato). Foods high in carbohydrates are often helpful.  Do not drink liquids with your meals. Drink liquids between meals.  Avoid greasy, fatty, and spicy foods.  Get someone to cook for you if the smell of any food causes nausea and vomiting.  If you feel nauseous after taking prenatal vitamins, take the vitamins at night or with a snack.  Snack on protein foods (nuts, yogurt, cheese) between meals if you are hungry.  Eat unsweetened gelatins for desserts.  Wearing an acupressure wristband (worn for sea sickness) may be helpful.  Acupuncture may be  helpful.  Do not smoke.  Get a humidifier to keep the air in your house free of odors.  Get plenty of fresh air. Contact a health care provider if:  Your home remedies are not working, and you need medicine.  You feel dizzy or lightheaded.  You are losing weight. Get help right away if:  You have persistent and uncontrolled nausea and vomiting.  You pass out (faint). This information is not intended to replace advice given to you by your health care provider. Make sure you discuss any questions you have with your health care provider. Document Released: 11/13/2006 Document Revised: 02/28/2016 Document Reviewed: 03/09/2013 Elsevier Interactive Patient Education  2017 [employer].  First Trimester of Pregnancy The first trimester of pregnancy is from week 1 until the end of week 13 (months 1 through 3). During this time, your baby will begin to  develop inside you. At 6-8 weeks, the eyes and face are formed, and the heartbeat can be seen on ultrasound. At the end of 12 weeks, all the baby's organs are formed. Prenatal care is all the medical care you receive before the birth of your baby. Make sure you get good prenatal care and follow all of your doctor's instructions. Follow these instructions at home: Medicines  Take over-the-counter and prescription medicines only as told by your doctor. Some medicines are safe and some medicines are not safe during pregnancy.  Take a prenatal vitamin that contains at least 600 micrograms (mcg) of folic acid.  If you have trouble pooping (constipation), take medicine that will make your stool soft (stool softener) if your doctor approves. Eating and drinking  Eat regular, healthy meals.  Your doctor will tell you the amount of weight gain that is right for you.  Avoid raw meat and uncooked cheese.  If you feel sick to your stomach (nauseous) or throw up (vomit): ? Eat 4 or 5 small meals a day instead of 3 large meals. ? Try eating a few soda crackers. ? Drink liquids between meals instead of during meals.  To prevent constipation: ? Eat foods that are high in fiber, like fresh fruits and vegetables, whole grains, and beans. ? Drink enough fluids to keep your pee (urine) clear or pale yellow. Activity  Exercise only as told by your doctor. Stop exercising if you have cramps or pain in your lower belly (abdomen) or low back.  Do not exercise if it is too hot, too humid, or if you are in a place of great height (high altitude).  Try to avoid standing for long periods of time. Move your legs often if you must stand in one place for a long time.  Avoid heavy lifting.  Wear low-heeled shoes. Sit and stand up straight.  You can have sex unless your doctor tells you not to. Relieving pain and discomfort  Wear a good support bra if your breasts are sore.  Take warm water baths (sitz  baths) to soothe pain or discomfort caused by hemorrhoids. Use hemorrhoid cream if your doctor says it is okay.  Rest with your legs raised if you have leg cramps or low back pain.  If you have puffy, bulging veins (varicose veins) in your legs: ? Wear support hose or compression stockings as told by your doctor. ? Raise (elevate) your feet for 15 minutes, 3-4 times a day. ? Limit salt in your food. Prenatal care  Schedule your prenatal visits by the twelfth week of pregnancy.  Write down your questions.  Take them to your prenatal visits.  Keep all your prenatal visits as told by your doctor. This is important. Safety  Wear your seat belt at all times when driving.  Make a list of emergency phone numbers. The list should include numbers for family, friends, the hospital, and police and fire departments. General instructions  Ask your doctor for a referral to a local prenatal class. Begin classes no later than at the start of month 6 of your pregnancy.  Ask for help if you need counseling or if you need help with nutrition. Your doctor can give you advice or tell you where to go for help.  Do not use hot tubs, steam rooms, or saunas.  Do not douche or use tampons or scented sanitary pads.  Do not cross your legs for long periods of time.  Avoid all herbs and alcohol. Avoid drugs that are not approved by your doctor.  Do not use any tobacco products, including cigarettes, chewing tobacco, and electronic cigarettes. If you need help quitting, ask your doctor. You may get counseling or other support to help you quit.  Avoid cat litter boxes and soil used by cats. These carry germs that can cause birth defects in the baby and can cause a loss of your baby (miscarriage) or stillbirth.  Visit your dentist. At home, brush your teeth with a soft toothbrush. Be gentle when you floss. Contact a doctor if:  You are dizzy.  You have mild cramps or pressure in your lower belly.  You  have a nagging pain in your belly area.  You continue to feel sick to your stomach, you throw up, or you have watery poop (diarrhea).  You have a bad smelling fluid coming from your vagina.  You have pain when you pee (urinate).  You have increased puffiness (swelling) in your face, hands, legs, or ankles. Get help right away if:  You have a fever.  You are leaking fluid from your vagina.  You have spotting or bleeding from your vagina.  You have very bad belly cramping or pain.  You gain or lose weight rapidly.  You throw up blood. It may look like coffee grounds.  You are around people who have [place] measles, fifth disease, or chickenpox.  You have a very bad headache.  You have shortness of breath.  You have any kind of trauma, such as from a fall or a car accident. Summary  The first trimester of pregnancy is from week 1 until the end of week 13 (months 1 through 3).  To take care of yourself and your unborn baby, you will need to eat healthy meals, take medicines only if your doctor tells you to do so, and do activities that are safe for you and your baby.  Keep all follow-up visits as told by your doctor. This is important as your doctor will have to ensure that your baby is healthy and growing well. This information is not intended to replace advice given to you by your health care provider. Make sure you discuss any questions you have with your health care provider. Document Released: 03/10/2008 Document Revised: 09/30/2016 Document Reviewed: 09/30/2016 Elsevier Interactive Patient Education  2017 Elsevier Inc.  Ovarian Cyst An ovarian cyst is a fluid-filled sac that forms on an ovary. The ovaries are small organs that produce eggs in women. Various types of cysts can form on the ovaries. Some may cause symptoms and require treatment. Most ovarian cysts go away on their own,  are not cancerous (are benign), and do not cause problems. Common types of ovarian cysts  include:  Functional (follicle) cysts. ? Occur during the menstrual cycle, and usually go away with the next menstrual cycle if you do not get pregnant. ? Usually cause no symptoms.  Endometriomas. ? Are cysts that form from the tissue that lines the uterus (endometrium). ? Are sometimes called chocolate cysts because they become filled with blood that turns brown. ? Can cause pain in the lower abdomen during intercourse and during your period.  Cystadenoma cysts. ? Develop from cells on the outside surface of the ovary. ? Can get very large and cause lower abdomen pain and pain with intercourse. ? Can cause severe pain if they twist or break open (rupture).  Dermoid cysts. ? Are sometimes found in both ovaries. ? May contain different kinds of body tissue, such as skin, teeth, hair, or cartilage. ? Usually do not cause symptoms unless they get very big.  Theca lutein cysts. ? Occur when too much of a certain hormone (human chorionic gonadotropin) is produced and overstimulates the ovaries to produce an egg. ? Are most common after having procedures used to assist with the conception of a baby (in vitro fertilization).  What are the causes? Ovarian cysts may be caused by:  Ovarian hyperstimulation syndrome. This is a condition that can develop from taking fertility medicines. It causes multiple large ovarian cysts to form.  Polycystic ovarian syndrome (PCOS). This is a common hormonal disorder that can cause ovarian cysts, as well as problems with your period or fertility.  What increases the risk? The following factors may make you more likely to develop ovarian cysts:  Being overweight or obese.  Taking fertility medicines.  Taking certain forms of hormonal birth control.  Smoking.  What are the signs or symptoms? Many ovarian cysts do not cause symptoms. If symptoms are present, they may include:  Pelvic pain or pressure.  Pain in the lower abdomen.  Pain during  sex.  Abdominal swelling.  Abnormal menstrual periods.  Increasing pain with menstrual periods.  How is this diagnosed? These cysts are commonly found during a routine pelvic exam. You may have tests to find out more about the cyst, such as:  Ultrasound.  X-ray of the pelvis.  CT scan.  MRI.  Blood tests.  How is this treated? Many ovarian cysts go away on their own without treatment. Your health care provider may want to check your cyst regularly for 2-3 months to see if it changes. If you are in menopause, it is especially important to have your cyst monitored closely because menopausal women have a higher rate of ovarian cancer. When treatment is needed, it may include:  Medicines to help relieve pain.  A procedure to drain the cyst (aspiration).  Surgery to remove the whole cyst.  Hormone treatment or birth control pills. These methods are sometimes used to help dissolve a cyst.  Follow these instructions at home:  Take over-the-counter and prescription medicines only as told by your health care provider.  Do not drive or use heavy machinery while taking prescription pain medicine.  Get regular pelvic exams and Pap tests as often as told by your health care provider.  Return to your normal activities as told by your health care provider. Ask your health care provider what activities are safe for you.  Do not use any products that contain nicotine or tobacco, such as cigarettes and e-cigarettes. If you need help quitting, ask  your health care provider.  Keep all follow-up visits as told by your health care provider. This is important. Contact a health care provider if:  Your periods are late, irregular, or painful, or they stop.  You have pelvic pain that does not go away.  You have pressure on your bladder or trouble emptying your bladder completely.  You have pain during sex.  You have any of the following in your abdomen: ? A feeling of  fullness. ? Pressure. ? Discomfort. ? Pain that does not go away. ? Swelling.  You feel generally ill.  You become constipated.  You lose your appetite.  You develop severe acne.  You start to have more body hair and facial hair.  You are gaining weight or losing weight without changing your exercise and eating habits.  You think you may be pregnant. Get help right away if:  You have abdominal pain that is severe or gets worse.  You cannot eat or drink without vomiting.  You suddenly develop a fever.  Your menstrual period is much heavier than usual. This information is not intended to replace advice given to you by your health care provider. Make sure you discuss any questions you have with your health care provider. Document Released: 09/22/2005 Document Revised: 04/11/2016 Document Reviewed: 02/24/2016 Elsevier Interactive Patient Education  [employer].

## 2017-12-16 NOTE — MAU Note (Signed)
Pt presents with c/o VB and abdominal cramping.  Reports she went to the bathroom approximately 1 hour ago and tissue came out.  Pt reports she's not currently bleeding but has dark brown vaginal discharge

## 2017-12-16 NOTE — MAU Provider Note (Addendum)
History     CSN: [id number]  Arrival date and time: 12/16/17 1736   First Provider Initiated Contact with Patient 12/16/17 1938      Chief Complaint  Patient presents with  . Vaginal Bleeding   Ms.  Krista Rodgers is a 27 y.o. year old [id number] female at [redacted]w[redacted]d weeks gestation who presents to MAU reporting vaginal bleeding and "passed a mass" around 1530 and lots of lower LT pelvic pain. Once she had the increased blood and passed that mass, her bleeding and pain stopped. She reports that the pain is starting back up now.  Vaginal Bleeding  The patient's primary symptoms include pelvic pain (LT lower). The patient's pertinent negatives include no genital itching, genital lesions or genital odor. This is a new problem. The current episode started today. She is pregnant. Associated symptoms include abdominal pain, nausea and vomiting. Pertinent negatives include no constipation or diarrhea. Nothing aggravates the symptoms. She has tried nothing for the symptoms.   RN Note: Pt presents with c/o VB and abdominal cramping.  Reports she went to the bathroom approximately 1 hour ago and tissue came out.  Pt reports she's not currently bleeding but has dark brown vaginal discharge    Past Medical History:  Diagnosis Date  . Hyperlipidemia   . Hypertension     Past Surgical History:  Procedure Laterality Date  . NO PAST SURGERIES      No family history on file.  Social History   Tobacco Use  . Smoking status: Never Smoker  . Smokeless tobacco: Never Used  Substance Use Topics  . Alcohol use: No  . Drug use: No    Allergies:  Allergies  Allergen Reactions  . Toradol [Ketorolac Tromethamine] Hives  . Amoxicillin Hives    Has patient had a PCN reaction causing immediate rash, facial/tongue/throat swelling, SOB or lightheadedness with hypotension: Yes Has patient had a PCN reaction causing severe rash involving mucus membranes or skin necrosis: Unk Has patient had a PCN reaction  that required hospitalization: Yes Has patient had a PCN reaction occurring within the last 10 years: Yes If all of the above answers are "NO", then may proceed with Cephalosporin use.   Marland Kitchen Peach [Prunus Persica] Hives    Medications Prior to Admission  Medication Sig Dispense Refill Last Dose  . metroNIDAZOLE (FLAGYL) 500 MG tablet Take 1 tablet (500 mg total) by mouth 2 (two) times daily. 14 tablet 0 Past Week at Unknown time  . Prenatal Vit-Fe Fumarate-FA (PRENATAL VITAMIN PLUS LOW IRON) 27-1 MG TABS Take 1 tablet by mouth daily. 30 tablet 2 12/15/2017 at Unknown time    Review of Systems  Constitutional: Negative.   HENT: Negative.   Eyes: Negative.   Respiratory: Negative.   Cardiovascular: Negative.   Gastrointestinal: Positive for abdominal pain, nausea and vomiting. Negative for constipation and diarrhea.  Endocrine: Negative.   Genitourinary: Positive for pelvic pain (LT lower) and vaginal bleeding ("passed a mass" earlier).  Musculoskeletal: Negative.   Skin: Negative.   Allergic/Immunologic: Negative.   Neurological: Negative.   Hematological: Negative.   Psychiatric/Behavioral: Negative.    Physical Exam   Blood pressure 119/80, pulse 89, temperature 99.1 F (37.3 C), temperature source Oral, resp. rate 20, height 5\' 1"  (1.549 m), weight 144 lb 12 oz (65.7 kg), last menstrual period 11/01/2017, SpO2 100 %.  Physical Exam  Nursing note and vitals reviewed. Constitutional: She is oriented to person, place, and time. She appears well-developed and well-nourished.  HENT:  Head:  Normocephalic and atraumatic.  Eyes: Pupils are equal, round, and reactive to light.  Neck: Normal range of motion.  Cardiovascular: Normal rate, regular rhythm and normal heart sounds.  Respiratory: Effort normal and breath sounds normal.  GI: Soft. Bowel sounds are normal.  Genitourinary:  Genitourinary Comments: Uterus: non-tender, SE: cervix is smooth, pink, no lesions, scant amt of thin,  pink vaginal d/c, closed/long/firm, no CMT or friability, mild LT adnexal tenderness   Musculoskeletal: Normal range of motion.  Neurological: She is alert and oriented to person, place, and time.  Skin: Skin is warm and dry.  Psychiatric: She has a normal mood and affect. Her behavior is normal. Judgment and thought content normal.    MAU Course  Procedures  MDM Pelvic exam CBC with Diff CMP OB <14 wks TVUS Phenergan 25 mg IM  Results for orders placed or performed during the hospital encounter of 12/16/17 (from the past 24 hour(s))  Urinalysis, Routine w reflex microscopic     Status: None   Collection Time: 12/16/17  6:05 PM  Result Value Ref Range   Color, Urine YELLOW YELLOW   APPearance CLEAR CLEAR   Specific Gravity, Urine 1.023 1.005 - 1.030   pH 6.0 5.0 - 8.0   Glucose, UA NEGATIVE NEGATIVE mg/dL   Hgb urine dipstick NEGATIVE NEGATIVE   Bilirubin Urine NEGATIVE NEGATIVE   Ketones, ur NEGATIVE NEGATIVE mg/dL   Protein, ur NEGATIVE NEGATIVE mg/dL   Nitrite NEGATIVE NEGATIVE   Leukocytes, UA NEGATIVE NEGATIVE  CBC with Differential/Platelet     Status: Abnormal (Preliminary result)   Collection Time: 12/16/17  9:47 PM  Result Value Ref Range   WBC 9.6 4.0 - 10.5 K/uL   RBC 4.26 3.87 - 5.11 MIL/uL   Hemoglobin 11.5 (L) 12.0 - 15.0 g/dL   HCT 16.134.8 (L) 09.636.0 - 04.546.0 %   MCV 81.7 78.0 - 100.0 fL   MCH 27.0 26.0 - 34.0 pg   MCHC 33.0 30.0 - 36.0 g/dL   RDW 40.913.1 81.111.5 - 91.415.5 %   Platelets 228 150 - 400 K/uL   Neutrophils Relative % 71 %   Neutro Abs 6.8 1.7 - 7.7 K/uL   Lymphocytes Relative 25 %   Lymphs Abs 2.5 0.7 - 4.0 K/uL   Monocytes Relative 3 %   Monocytes Absolute 0.3 0.1 - 1.0 K/uL   Eosinophils Relative 1 %   Eosinophils Absolute 0.1 0.0 - 0.7 K/uL   Basophils Relative 0 %   Basophils Absolute 0.0 0.0 - 0.1 K/uL   Other PENDING %   [place] Ob Less Than 14 Weeks With Ob Transvaginal  Result Date: 12/16/2017 CLINICAL DATA:  Initial evaluation for vaginal  bleeding in early pregnancy. EXAM: OBSTETRIC <14 WK US AND TRANSVAGINAL OB US TECHNIQUE: Both transabdominal and transvaginal ultrasound examinations were performed for complete evaluation of the gestation as well as the maternal uterus, adnexal regions, and pelvic cul-de-sac. Transvaginal technique was performed to assess early pregnancy. COMPARISON:  None. FINDINGS: Intrauterine gestational sac: Single Yolk sac:  Present Embryo:  Present Cardiac Activity: Present Heart Rate: 124 bpm CRL: 8.5 mm   6 w   5 d                  US EDC: 08/06/2018 Subchorionic hemorrhage:  None visualized. Maternal uterus/adnexae: Right ovary normal in appearance. Mildly complex left ovarian cyst measuring 4.4 x 4.1 x 4.6 cm again seen, measuring slightly bigger on today's exam (previously 4.0 x 3.7 x 3.9 cm). Single  internal septation is similar to previous. Peripherally increased vascularity also similar. Possible vascularity along the septation as well. IMPRESSION: 1. Single viable intrauterine pregnancy as above without complication, estimated gestational age [redacted] weeks and 5 days by crown-rump length. 2. Slight interval increase in size of 4.6 cm complex left ovarian cyst. Continued attention at follow-up recommended. Electronically Signed   By: Rise Mu M.D.   On: 12/16/2017 20:39     Report given to and care assumed by Wynelle Bourgeois, CNM @ 2200  Raelyn Mora, MSN, CNM 12/16/2017, 10:03 PM   Assessment and Plan  Pregnancy at [redacted]w[redacted]d Bleeding in early pregnancy - Plan: US OB LESS THAN 14 WEEKS WITH OB TRANSVAGINAL, US OB LESS THAN 14 WEEKS WITH OB TRANSVAGINAL, Discharge patient  Vomiting affecting pregnancy - Plan: Discharge patient  Ovarian cyst affecting pregnancy in first trimester, antepartum   Had nausea, medicated with Phenergan with good relief Rx Percocet for ovarian cyst, limited # Rx Phenergan for nausea List of prenatal providers given  Aviva Signs, CNM

## 2017-12-25 ENCOUNTER — Ambulatory Visit (HOSPITAL_COMMUNITY): Admission: RE | Admit: 2017-12-25 | Payer: 59 | Source: Ambulatory Visit

## 2017-12-29 ENCOUNTER — Inpatient Hospital Stay (HOSPITAL_COMMUNITY)
Admission: AD | Admit: 2017-12-29 | Discharge: 2017-12-29 | Disposition: A | Discharge status: Home / Self Care | Payer: 59 | Source: Ambulatory Visit | Attending: Family Medicine | Admitting: Family Medicine

## 2017-12-29 ENCOUNTER — Other Ambulatory Visit: Payer: Self-pay

## 2017-12-29 ENCOUNTER — Encounter (HOSPITAL_COMMUNITY): Payer: Self-pay | Admitting: *Deleted

## 2017-12-29 DIAGNOSIS — O98811 Other maternal infectious and parasitic diseases complicating pregnancy, first trimester: Secondary | ICD-10-CM | POA: Insufficient documentation

## 2017-12-29 DIAGNOSIS — Z880 Allergy status to penicillin: Secondary | ICD-10-CM | POA: Diagnosis not present

## 2017-12-29 DIAGNOSIS — B373 Candidiasis of vulva and vagina: Secondary | ICD-10-CM | POA: Insufficient documentation

## 2017-12-29 DIAGNOSIS — N898 Other specified noninflammatory disorders of vagina: Secondary | ICD-10-CM | POA: Diagnosis not present

## 2017-12-29 DIAGNOSIS — Z3A08 8 weeks gestation of pregnancy: Secondary | ICD-10-CM | POA: Insufficient documentation

## 2017-12-29 DIAGNOSIS — B3731 Acute candidiasis of vulva and vagina: Secondary | ICD-10-CM

## 2017-12-29 DIAGNOSIS — O26891 Other specified pregnancy related conditions, first trimester: Secondary | ICD-10-CM | POA: Diagnosis not present

## 2017-12-29 LAB — URINALYSIS, ROUTINE W REFLEX MICROSCOPIC
Bilirubin Urine: NEGATIVE
Glucose, UA: NEGATIVE mg/dL
Hgb urine dipstick: NEGATIVE
Ketones, ur: 5 mg/dL — AB
NITRITE: NEGATIVE
PH: 5 (ref 5.0–8.0)
Protein, ur: NEGATIVE mg/dL
SPECIFIC GRAVITY, URINE: 1.027 (ref 1.005–1.030)

## 2017-12-29 LAB — WET PREP, GENITAL
CLUE CELLS WET PREP: NONE SEEN
SPERM: NONE SEEN
TRICH WET PREP: NONE SEEN

## 2017-12-29 MED ORDER — TERCONAZOLE 0.4 % VA CREA: 1.0000 | TOPICAL_CREAM | Freq: Every day | VAGINAL | 0 refills | Status: DC

## 2017-12-29 NOTE — MAU Provider Note (Signed)
History     CSN: [id number]  Arrival date and time: 12/29/17 1313   First Provider Initiated Contact with Patient 12/29/17 1400      Chief Complaint  Patient presents with  . Vaginal Discharge  . Vaginal Itching  . Vaginal Irritation   G3P0020 @[redacted]w[redacted]d  here with vaginal discharge and irritation. Sx started 2 days ago. Discharge is thin and yellow, causing vaginal itching. Denies VB. Having some mild low abd cramping since earlier in the pregnancy. No new partner. She is also concerned because she hasn't had any pregnancy sx in the last week.    OB History    Gravida  3   Para      Term      Preterm      AB  2   Living  0     SAB  2   TAB      Ectopic      Multiple      Live Births              Past Medical History:  Diagnosis Date  . Hyperlipidemia   . Hypertension     Past Surgical History:  Procedure Laterality Date  . NO PAST SURGERIES      History reviewed. No pertinent family history.  Social History   Tobacco Use  . Smoking status: Never Smoker  . Smokeless tobacco: Never Used  Substance Use Topics  . Alcohol use: No  . Drug use: No    Allergies:  Allergies  Allergen Reactions  . Toradol [Ketorolac Tromethamine] Hives  . Amoxicillin Hives    Has patient had a PCN reaction causing immediate rash, facial/tongue/throat swelling, SOB or lightheadedness with hypotension: Yes Has patient had a PCN reaction causing severe rash involving mucus membranes or skin necrosis: Unk Has patient had a PCN reaction that required hospitalization: Yes Has patient had a PCN reaction occurring within the last 10 years: Yes If all of the above answers are "NO", then may proceed with Cephalosporin use.   Marland Kitchen Peach [Prunus Persica] Hives    Medications Prior to Admission  Medication Sig Dispense Refill Last Dose  . metroNIDAZOLE (FLAGYL) 500 MG tablet Take 1 tablet (500 mg total) by mouth 2 (two) times daily. 14 tablet 0 Past Week at Unknown time  .  oxyCODONE-acetaminophen (PERCOCET/ROXICET) 5-325 MG tablet Take 1 tablet by mouth every 6 (six) hours as needed. 20 tablet 0   . Prenatal Vit-Fe Fumarate-FA (PRENATAL VITAMIN PLUS LOW IRON) 27-1 MG TABS Take 1 tablet by mouth daily. 30 tablet 2 12/15/2017 at Unknown time  . promethazine (PHENERGAN) 25 MG tablet Take 1 tablet (25 mg total) by mouth every 6 (six) hours as needed for nausea or vomiting. 30 tablet 2     Review of Systems  Constitutional: Negative for fever.  Gastrointestinal: Positive for abdominal pain.  Genitourinary: Positive for vaginal discharge. Negative for dysuria and vaginal bleeding.   Physical Exam   Blood pressure (!) 134/97, temperature 98.7 F (37.1 C), temperature source Oral, resp. rate 18, height 5\' 1"  (1.549 m), weight 145 lb (65.8 kg), last menstrual period 11/01/2017, SpO2 100 %.  Physical Exam  Nursing note and vitals reviewed. Constitutional: She is oriented to person, place, and time. She appears well-developed and well-nourished. No distress.  HENT:  Head: Normocephalic and atraumatic.  Neck: Normal range of motion.  Respiratory: Effort normal. No respiratory distress.  GI: Soft. She exhibits no distension. There is no tenderness.  Genitourinary:  Genitourinary  Comments: External: no lesions or erythema Vagina: rugated, pink, moist, thick white curdy discharge    Musculoskeletal: Normal range of motion.  Neurological: She is alert and oriented to person, place, and time.  Skin: Skin is warm and dry.  Psychiatric: She has a normal mood and affect.   Limited bedside US: viable, active fetus, +cardiac activity, subj. nml AFV  Results for orders placed or performed during the hospital encounter of 12/29/17 (from the past 24 hour(s))  Urinalysis, Routine w reflex microscopic     Status: Abnormal   Collection Time: 12/29/17  1:31 PM  Result Value Ref Range   Color, Urine YELLOW YELLOW   APPearance CLEAR CLEAR   Specific Gravity, Urine 1.027 1.005  - 1.030   pH 5.0 5.0 - 8.0   Glucose, UA NEGATIVE NEGATIVE mg/dL   Hgb urine dipstick NEGATIVE NEGATIVE   Bilirubin Urine NEGATIVE NEGATIVE   Ketones, ur 5 (A) NEGATIVE mg/dL   Protein, ur NEGATIVE NEGATIVE mg/dL   Nitrite NEGATIVE NEGATIVE   Leukocytes, UA SMALL (A) NEGATIVE   RBC / HPF 0-5 0 - 5 RBC/hpf   WBC, UA 0-5 0 - 5 WBC/hpf   Bacteria, UA RARE (A) NONE SEEN   Squamous Epithelial / LPF 0-5 (A) NONE SEEN   Mucus PRESENT   Wet prep, genital     Status: Abnormal   Collection Time: 12/29/17  2:10 PM  Result Value Ref Range   Yeast Wet Prep HPF POC PRESENT (A) NONE SEEN   Trich, Wet Prep NONE SEEN NONE SEEN   Clue Cells Wet Prep HPF POC NONE SEEN NONE SEEN   WBC, Wet Prep HPF POC MANY (A) NONE SEEN   Sperm NONE SEEN    MAU Course  Procedures  MDM Labs ordered and reviewed. No evidence of UTI or threatened SAB. Will treat yeast. Stable for discharge home.  Assessment and Plan   1. [redacted] weeks gestation of pregnancy   2. Yeast vaginitis    Discharge home Follow up with OB provider of choice to start care-list provided Rx Terazol-7 Return precautions  Allergies as of 12/29/2017      Reactions   Toradol [ketorolac Tromethamine] Hives   Amoxicillin Hives   Has patient had a PCN reaction causing immediate rash, facial/tongue/throat swelling, SOB or lightheadedness with hypotension: Yes Has patient had a PCN reaction causing severe rash involving mucus membranes or skin necrosis: Unk Has patient had a PCN reaction that required hospitalization: Yes Has patient had a PCN reaction occurring within the last 10 years: Yes If all of the above answers are "NO", then may proceed with Cephalosporin use.   Peach [prunus Persica] Hives      Medication List    STOP taking these medications   metroNIDAZOLE 500 MG tablet Commonly known as:  FLAGYL   oxyCODONE-acetaminophen 5-325 MG tablet Commonly known as:  PERCOCET/ROXICET     TAKE these medications   PRENATAL VITAMIN PLUS  LOW IRON 27-1 MG Tabs Take 1 tablet by mouth daily.   promethazine 25 MG tablet Commonly known as:  PHENERGAN Take 1 tablet (25 mg total) by mouth every 6 (six) hours as needed for nausea or vomiting.   terconazole 0.4 % vaginal cream Commonly known as:  TERAZOL 7 Place 1 applicator vaginally at bedtime.      Donette LarryMelanie Sarahmarie Leavey, CNM 12/29/2017, 2:15 PM

## 2017-12-29 NOTE — Discharge Instructions (Signed)

## 2017-12-29 NOTE — MAU Note (Signed)
Pt presents with c/o vaginal irritation, itching and discharge.  Reprts discharge is grey watery today, denies discharge is malodorous.  Denies VB. Pt also reports loss of symptoms regarding pregnancy.  States she's no longer nauseated, tired, and breast aren't tender.

## 2018-01-12 ENCOUNTER — Encounter (HOSPITAL_COMMUNITY): Payer: Self-pay | Admitting: *Deleted

## 2018-01-12 ENCOUNTER — Inpatient Hospital Stay (HOSPITAL_COMMUNITY)
Admission: AD | Admit: 2018-01-12 | Discharge: 2018-01-12 | Disposition: A | Discharge status: Home / Self Care | Payer: 59 | Source: Ambulatory Visit | Attending: Obstetrics & Gynecology | Admitting: Obstetrics & Gynecology

## 2018-01-12 DIAGNOSIS — O10011 Pre-existing essential hypertension complicating pregnancy, first trimester: Secondary | ICD-10-CM | POA: Insufficient documentation

## 2018-01-12 DIAGNOSIS — O26891 Other specified pregnancy related conditions, first trimester: Secondary | ICD-10-CM | POA: Diagnosis not present

## 2018-01-12 DIAGNOSIS — K5901 Slow transit constipation: Secondary | ICD-10-CM

## 2018-01-12 DIAGNOSIS — Z880 Allergy status to penicillin: Secondary | ICD-10-CM | POA: Diagnosis not present

## 2018-01-12 DIAGNOSIS — O210 Mild hyperemesis gravidarum: Secondary | ICD-10-CM | POA: Insufficient documentation

## 2018-01-12 DIAGNOSIS — Z3A10 10 weeks gestation of pregnancy: Secondary | ICD-10-CM | POA: Insufficient documentation

## 2018-01-12 LAB — URINALYSIS, COMPLETE (UACMP) WITH MICROSCOPIC
BILIRUBIN URINE: NEGATIVE
Bacteria, UA: NONE SEEN
Glucose, UA: NEGATIVE mg/dL
Hgb urine dipstick: NEGATIVE
Ketones, ur: 80 mg/dL — AB
LEUKOCYTES UA: NEGATIVE
Nitrite: NEGATIVE
PH: 5 (ref 5.0–8.0)
Protein, ur: 30 mg/dL — AB
SPECIFIC GRAVITY, URINE: 1.029 (ref 1.005–1.030)

## 2018-01-12 LAB — COMPREHENSIVE METABOLIC PANEL
ALK PHOS: 43 U/L (ref 38–126)
ALT: 9 U/L — AB (ref 14–54)
AST: 17 U/L (ref 15–41)
Albumin: 3.9 g/dL (ref 3.5–5.0)
Anion gap: 10 (ref 5–15)
BILIRUBIN TOTAL: 0.9 mg/dL (ref 0.3–1.2)
BUN: 9 mg/dL (ref 6–20)
CALCIUM: 8.8 mg/dL — AB (ref 8.9–10.3)
CO2: 20 mmol/L — ABNORMAL LOW (ref 22–32)
CREATININE: 0.48 mg/dL (ref 0.44–1.00)
Chloride: 105 mmol/L (ref 101–111)
Glucose, Bld: 86 mg/dL (ref 65–99)
Potassium: 3.6 mmol/L (ref 3.5–5.1)
Sodium: 135 mmol/L (ref 135–145)
Total Protein: 7.7 g/dL (ref 6.5–8.1)

## 2018-01-12 LAB — WET PREP, GENITAL
Clue Cells Wet Prep HPF POC: NONE SEEN
Sperm: NONE SEEN
Trich, Wet Prep: NONE SEEN
YEAST WET PREP: NONE SEEN

## 2018-01-12 LAB — CBC
HEMATOCRIT: 36.6 % (ref 36.0–46.0)
HEMOGLOBIN: 12.3 g/dL (ref 12.0–15.0)
MCH: 27.4 pg (ref 26.0–34.0)
MCHC: 33.6 g/dL (ref 30.0–36.0)
MCV: 81.5 fL (ref 78.0–100.0)
PLATELETS: 231 10*3/uL (ref 150–400)
RBC: 4.49 MIL/uL (ref 3.87–5.11)
RDW: 13.1 % (ref 11.5–15.5)
WBC: 12.6 10*3/uL — ABNORMAL HIGH (ref 4.0–10.5)

## 2018-01-12 MED ORDER — METOCLOPRAMIDE HCL 10 MG PO TABS: 10.0000 mg | ORAL_TABLET | Freq: Three times a day (TID) | ORAL | 1 refills | Status: DC

## 2018-01-12 MED ORDER — METOCLOPRAMIDE HCL 10 MG PO TABS
10.0000 mg | ORAL_TABLET | Freq: Once | ORAL | Status: AC
  Administered 2018-01-12: 10 mg via ORAL
  Filled 2018-01-12: qty 1

## 2018-01-12 MED ORDER — LACTATED RINGERS IV BOLUS
500.0000 mL | Freq: Once | INTRAVENOUS | Status: AC
  Administered 2018-01-12: 1000 mL via INTRAVENOUS

## 2018-01-12 MED ORDER — DOCUSATE SODIUM 100 MG PO CAPS: 100.0000 mg | ORAL_CAPSULE | Freq: Every day | ORAL | 0 refills | Status: DC

## 2018-01-12 MED ORDER — SODIUM CHLORIDE 0.9 % IV SOLN
Freq: Once | INTRAVENOUS | Status: AC
  Administered 2018-01-12: 04:00:00 via INTRAVENOUS
  Filled 2018-01-12: qty 1000

## 2018-01-12 MED ORDER — SODIUM CHLORIDE 0.9 % IV SOLN
8.0000 mg | Freq: Once | INTRAVENOUS | Status: AC
  Administered 2018-01-12: 8 mg via INTRAVENOUS
  Filled 2018-01-12: qty 4

## 2018-01-12 MED ORDER — ASPIRIN 81 MG PO CHEW: 81.0000 mg | CHEWABLE_TABLET | Freq: Every day | ORAL | 3 refills | Status: DC

## 2018-01-12 NOTE — MAU Note (Signed)
PT  SAYS  NOT EATING  OR  DRINKING    DRANK WATER YESTERDAY.   THIS AM - ATE  RICE.   WAS HERE  2 WEEKS  AGO FOR  CRAMPING AND  BLEEDING.   NO MEDS  FOR N/V.

## 2018-01-12 NOTE — Discharge Instructions (Signed)
-  Start Aspirin at 12 weeks.   Constipation, Adult Constipation is when a person:  Poops (has a bowel movement) fewer times in a week than normal.  Has a hard time pooping.  Has poop that is dry, hard, or bigger than normal.  Follow these instructions at home: Eating and drinking   Eat foods that have a lot of fiber, such as: ? Fresh fruits and vegetables. ? Whole grains. ? Beans.  Eat less of foods that are high in fat, low in fiber, or overly processed, such as: ? [place] fries. ? Hamburgers. ? Cookies. ? Candy. ? Soda.  Drink enough fluid to keep your pee (urine) clear or pale yellow. General instructions  Exercise regularly or as told by your doctor.  Go to the restroom when you feel like you need to poop. Do not hold it in.  Take over-the-counter and prescription medicines only as told by your doctor. These include any fiber supplements.  Do pelvic floor retraining exercises, such as: ? Doing deep breathing while relaxing your lower belly (abdomen). ? Relaxing your pelvic floor while pooping.  Watch your condition for any changes.  Keep all follow-up visits as told by your doctor. This is important. Contact a doctor if:  You have pain that gets worse.  You have a fever.  You have not pooped for 4 days.  You throw up (vomit).  You are not hungry.  You lose weight.  You are bleeding from the anus.  You have thin, pencil-like poop (stool). Get help right away if:  You have a fever, and your symptoms suddenly get worse.  You leak poop or have blood in your poop.  Your belly feels hard or bigger than normal (is bloated).  You have very bad belly pain.  You feel dizzy or you faint. This information is not intended to replace advice given to you by your health care provider. Make sure you discuss any questions you have with your health care provider. Document Released: 03/10/2008 Document Revised: 04/11/2016 Document Reviewed: 03/12/2016 Elsevier  Interactive Patient Education  2018 [employer]

## 2018-01-12 NOTE — MAU Provider Note (Addendum)
Patient Krista Rodgers is a 27 y.o.  G3P0020 At [redacted]w[redacted]d here with complaints of abdominal pain and nausea and vomiting. She has a documented IUP (see imaging). She denies vaginal discharge, pain with urination, HA. She has a documented IUP from early [place] on 12-16-2017.   Patient is a known chronic hypertensive on blood pressure medications. She was told at [facility] to stop taking her bp meds; she does not remember what medications she was on.  History     CSN: [id number]  Arrival date and time: 01/12/18 0040   First Provider Initiated Contact with Patient 01/12/18 [phone]      Chief Complaint  Patient presents with  . Emesis   Emesis   This is a new problem. The current episode started in the past 7 days. The problem occurs 5 to 10 times per day. The problem has been gradually worsening. The emesis has an appearance of bile. There has been no fever. Associated symptoms include abdominal pain. Pertinent negatives include no diarrhea.  Abdominal Pain  This is a new problem. The current episode started yesterday. The onset quality is gradual. The pain is located in the suprapubic region (she feels it a little bit more on the left). The pain is at a severity of 8/10. Associated symptoms include vomiting. Pertinent negatives include no diarrhea. The pain is aggravated by movement. Relieved by: crouching down and leaning forward.  She has been having increased vomiting over the past two days; did not have this before. She is throwing up multiple times a day; is that able to keep anyting down.  The vomiting got much worse tonight at 9 pm.  She feels like her BM has been harder and that she is straining more. Last BM was on Friday ( three days ago).  OB History    Gravida  3   Para      Term      Preterm      AB  2   Living  0     SAB  2   TAB      Ectopic      Multiple      Live Births              Past Medical History:  Diagnosis Date  . Hyperlipidemia   . Hypertension     Past  Surgical History:  Procedure Laterality Date  . NO PAST SURGERIES      No family history on file.  Social History   Tobacco Use  . Smoking status: Never Smoker  . Smokeless tobacco: Never Used  Substance Use Topics  . Alcohol use: No  . Drug use: No    Allergies:  Allergies  Allergen Reactions  . Toradol [Ketorolac Tromethamine] Hives  . Amoxicillin Hives    Has patient had a PCN reaction causing immediate rash, facial/tongue/throat swelling, SOB or lightheadedness with hypotension: Yes Has patient had a PCN reaction causing severe rash involving mucus membranes or skin necrosis: Unk Has patient had a PCN reaction that required hospitalization: Yes Has patient had a PCN reaction occurring within the last 10 years: Yes If all of the above answers are "NO", then may proceed with Cephalosporin use.   Marland Kitchen Peach [Prunus Persica] Hives    Medications Prior to Admission  Medication Sig Dispense Refill Last Dose  . Prenatal Vit-Fe Fumarate-FA (PRENATAL VITAMIN PLUS LOW IRON) 27-1 MG TABS Take 1 tablet by mouth daily. 30 tablet 2 12/15/2017 at Unknown time  . promethazine (  PHENERGAN) 25 MG tablet Take 1 tablet (25 mg total) by mouth every 6 (six) hours as needed for nausea or vomiting. 30 tablet 2   . terconazole (TERAZOL 7) 0.4 % vaginal cream Place 1 applicator vaginally at bedtime. 45 g 0     Review of Systems  Constitutional: Negative.   HENT: Negative.   Respiratory: Negative.   Cardiovascular: Negative.   Gastrointestinal: Positive for abdominal pain and vomiting. Negative for diarrhea.  Genitourinary: Negative for vaginal bleeding, vaginal discharge and vaginal pain.  Musculoskeletal: Negative.   Neurological: Negative.   Psychiatric/Behavioral: Negative.    Physical Exam   Blood pressure (!) 136/100, pulse 84, temperature 99.1 F (37.3 C), temperature source Oral, height 5\' 1"  (1.549 m), weight 142 lb 8 oz (64.6 kg), last menstrual period 11/01/2017.  Physical Exam   Constitutional: She is oriented to person, place, and time. She appears well-developed.  HENT:  Head: Normocephalic.  Neck: Normal range of motion.  Respiratory: Effort normal.  GI: Soft. She exhibits no distension and no mass. There is no tenderness. There is no rebound and no guarding.  Genitourinary: Vagina normal.  Musculoskeletal: Normal range of motion.  Neurological: She is alert and oriented to person, place, and time.  Skin: Skin is warm and dry.  Psychiatric: She has a normal mood and affect.   Normal external female genitalia. No CMT, supraubic pain or adnexal tenderness.   MAU Course  Procedures  MDM -wet prep: negative -GC chlamydia pending -UA: 80 of ketones FHR is 169 CBC and CMP normal   Patient received 1 L of MVI and 1 LR NS; patient attempted to eat after 8 mg of Zofran but vomited her ice. Will try Reglan.  0631: after 10 mg of reglan patient was able to keep down ice chips. Feels better.   136/100;  BP is elevated in MAU in 136/100 initially, now 126/89. Labs not drawn; meds not prescribed.     Assessment and Plan   1. Morning sickness   2. Slow transit constipation    2. Patient stable for discharge. RX for Colace and Reglan and baby ASA.  3. Encouraged patient to take colace twice a day and as well as fiber pills with plenty of water. Patient understands that if she is to continue on reglan she needs to prevent constipation. Continue taking phenergan as well.  4. Start baby ASA at 12 weeks.  5. List of ob-gyn providers given; [facility]  6. All questions answered.  Charlesetta GaribaldiKathryn Lorraine Aryella Besecker 01/12/2018, 3:54 AM

## 2018-01-12 NOTE — MAU Note (Signed)
WENT  TO CLINIC  LAST MTH.    CALLED  THIS AM  FOR  AN APPOINTMENT  WITH  DR COUSINS

## 2018-01-12 NOTE — MAU Note (Signed)
PO ICE CHIPS  

## 2018-01-13 LAB — GC/CHLAMYDIA PROBE AMP (~~LOC~~) NOT AT ARMC
CHLAMYDIA, DNA PROBE: NEGATIVE
NEISSERIA GONORRHEA: NEGATIVE

## 2018-01-18 LAB — OB RESULTS CONSOLE ABO/RH: RH TYPE: POSITIVE

## 2018-01-18 LAB — OB RESULTS CONSOLE RPR: RPR: NONREACTIVE

## 2018-01-18 LAB — OB RESULTS CONSOLE HIV ANTIBODY (ROUTINE TESTING): HIV: NONREACTIVE

## 2018-01-18 LAB — OB RESULTS CONSOLE RUBELLA ANTIBODY, IGM: RUBELLA: IMMUNE

## 2018-01-18 LAB — OB RESULTS CONSOLE HEPATITIS B SURFACE ANTIGEN: HEP B S AG: NEGATIVE

## 2018-01-25 ENCOUNTER — Other Ambulatory Visit: Payer: Self-pay | Admitting: Obstetrics and Gynecology

## 2018-03-03 ENCOUNTER — Inpatient Hospital Stay (HOSPITAL_COMMUNITY): Payer: 59

## 2018-03-03 ENCOUNTER — Inpatient Hospital Stay (HOSPITAL_COMMUNITY)
Admission: AD | Admit: 2018-03-03 | Discharge: 2018-03-03 | Disposition: A | Discharge status: Home / Self Care | Payer: 59 | Source: Ambulatory Visit | Attending: Obstetrics and Gynecology | Admitting: Obstetrics and Gynecology

## 2018-03-03 ENCOUNTER — Other Ambulatory Visit: Payer: Self-pay

## 2018-03-03 ENCOUNTER — Encounter (HOSPITAL_COMMUNITY): Payer: Self-pay

## 2018-03-03 DIAGNOSIS — Z3A17 17 weeks gestation of pregnancy: Secondary | ICD-10-CM | POA: Diagnosis not present

## 2018-03-03 DIAGNOSIS — O26892 Other specified pregnancy related conditions, second trimester: Secondary | ICD-10-CM | POA: Diagnosis present

## 2018-03-03 DIAGNOSIS — N898 Other specified noninflammatory disorders of vagina: Secondary | ICD-10-CM

## 2018-03-03 DIAGNOSIS — O10012 Pre-existing essential hypertension complicating pregnancy, second trimester: Secondary | ICD-10-CM

## 2018-03-03 DIAGNOSIS — R109 Unspecified abdominal pain: Secondary | ICD-10-CM

## 2018-03-03 LAB — WET PREP, GENITAL
Sperm: NONE SEEN
TRICH WET PREP: NONE SEEN
YEAST WET PREP: NONE SEEN

## 2018-03-03 LAB — URINALYSIS, ROUTINE W REFLEX MICROSCOPIC
Bilirubin Urine: NEGATIVE
Glucose, UA: NEGATIVE mg/dL
Hgb urine dipstick: NEGATIVE
Ketones, ur: NEGATIVE mg/dL
Nitrite: NEGATIVE
PH: 6 (ref 5.0–8.0)
Protein, ur: NEGATIVE mg/dL
Specific Gravity, Urine: 1.023 (ref 1.005–1.030)

## 2018-03-03 LAB — AMNISURE RUPTURE OF MEMBRANE (ROM) NOT AT ARMC: Amnisure ROM: NEGATIVE

## 2018-03-03 LAB — POCT FERN TEST: POCT Fern Test: NEGATIVE

## 2018-03-03 NOTE — MAU Provider Note (Signed)
History     Chief Complaint  Patient presents with  . Abdominal Pain  . Vaginal Discharge   27 yo G1P) BF presents @ 17 weeks with c/o gushing of fluid x 2 last night. No recent intecourse. No further Gushes today. Pt did not call regarding leakage last night. Pt c/o abdominal cramping that started today. (-) vaginal  Bleeding. (+) vaginal discharge w/o odor  OB History    Gravida  3   Para      Term      Preterm      AB  2   Living  0     SAB  2   TAB      Ectopic      Multiple      Live Births              Past Medical History:  Diagnosis Date  . Hyperlipidemia   . Hypertension     Past Surgical History:  Procedure Laterality Date  . NO PAST SURGERIES      History reviewed. No pertinent family history.  Social History   Tobacco Use  . Smoking status: Never Smoker  . Smokeless tobacco: Never Used  Substance Use Topics  . Alcohol use: No  . Drug use: No    Allergies:  Allergies  Allergen Reactions  . Toradol [Ketorolac Tromethamine] Hives  . Amoxicillin Hives    Has patient had a PCN reaction causing immediate rash, facial/tongue/throat swelling, SOB or lightheadedness with hypotension: Yes Has patient had a PCN reaction causing severe rash involving mucus membranes or skin necrosis: Unk Has patient had a PCN reaction that required hospitalization: Yes Has patient had a PCN reaction occurring within the last 10 years: Yes If all of the above answers are "NO", then may proceed with Cephalosporin use.   Marland Kitchen Peach [Prunus Persica] Hives    Medications Prior to Admission  Medication Sig Dispense Refill Last Dose  . Prenatal Vit-Fe Fumarate-FA (PRENATAL VITAMIN PLUS LOW IRON) 27-1 MG TABS Take 1 tablet by mouth daily. 30 tablet 2 03/02/2018 at Unknown time  . aspirin 81 MG chewable tablet Chew 1 tablet (81 mg total) by mouth daily. (Patient not taking: Reported on 03/03/2018) 30 tablet 3 Not Taking at Unknown time  . docusate sodium (COLACE)  100 MG capsule Take 1 capsule (100 mg total) by mouth daily. (Patient not taking: Reported on 03/03/2018) 60 capsule 0 Not Taking at Unknown time  . metoCLOPramide (REGLAN) 10 MG tablet Take 1 tablet (10 mg total) by mouth 3 (three) times daily with meals. 90 tablet 1   . promethazine (PHENERGAN) 25 MG tablet Take 1 tablet (25 mg total) by mouth every 6 (six) hours as needed for nausea or vomiting. (Patient not taking: Reported on 03/03/2018) 30 tablet 2 Not Taking at Unknown time  . terconazole (TERAZOL 7) 0.4 % vaginal cream Place 1 applicator vaginally at bedtime. (Patient not taking: Reported on 03/03/2018) 45 g 0 Not Taking at Unknown time     Physical Exam   Blood pressure (!) 132/97, pulse 96, temperature 99.2 F (37.3 C), temperature source Oral, resp. rate 18, weight 64.8 kg (142 lb 12 oz), last menstrual period 11/01/2017, SpO2 100 %.  General appearance: alert, cooperative and no distress Lungs: clear to auscultation bilaterally Heart: regular rate and rhythm, S1, S2 normal, no murmur, click, rub or gallop Abdomen: gravid nontender Pelvic: adnexae not palpable, cervix normal in appearance, external genitalia normal and vagina copious clumpy yellow  d/c. wet prep, fern done, amniosure done, cervix visually closed w/o any leakage wtih cough, bladder nontender, uterus gravid  16-17 weeks Extremities: no edema, redness or tenderness in the calves or thighs ED Course  A/P): Leaking fluid Vaginal discharge in pregnancy IUP @ 17 weeks P) fern test, amni sure. Wet prep. OB sono limited check fluid. U/a, ucx  MDM Addendum U/S: nl fluid (+) FHR Fern neg. Amni sure neg D/c home Keep sched OB appt,.  Serita Kyle, MD 1:11 PM 03/03/2018

## 2018-03-03 NOTE — Progress Notes (Signed)
Dr. Cherly Hensen given update-to assess patient

## 2018-03-03 NOTE — Discharge Instructions (Signed)
Call if vaginal bleeding, decreased fetal movement, call office for urine culture result on friday

## 2018-03-03 NOTE — MAU Note (Signed)
Pt states she had two gushes of fluid last night at midnight.  Patient last felt baby move on Saturday.  Experiencing cramping 8/10 since 5/26.

## 2018-03-03 NOTE — MAU Note (Signed)
Last night kept waking up in puddles, knows she didn't pee, but doesn't know what it was.  There was just a little bit when she woke up this morning, none since. Has been having these really bad cramps.

## 2018-03-04 LAB — CULTURE, OB URINE
Culture: NO GROWTH
SPECIAL REQUESTS: NORMAL

## 2018-05-04 ENCOUNTER — Encounter (HOSPITAL_COMMUNITY): Payer: Self-pay

## 2018-05-04 ENCOUNTER — Inpatient Hospital Stay (HOSPITAL_COMMUNITY): Payer: 59

## 2018-05-04 ENCOUNTER — Observation Stay (HOSPITAL_COMMUNITY)
Admission: AD | Admit: 2018-05-04 | Discharge: 2018-05-05 | Disposition: A | Discharge status: Home / Self Care | Payer: 59 | Source: Ambulatory Visit | Attending: Obstetrics and Gynecology | Admitting: Obstetrics and Gynecology

## 2018-05-04 DIAGNOSIS — O219 Vomiting of pregnancy, unspecified: Secondary | ICD-10-CM | POA: Insufficient documentation

## 2018-05-04 DIAGNOSIS — R509 Fever, unspecified: Secondary | ICD-10-CM | POA: Diagnosis not present

## 2018-05-04 DIAGNOSIS — O10012 Pre-existing essential hypertension complicating pregnancy, second trimester: Secondary | ICD-10-CM | POA: Insufficient documentation

## 2018-05-04 DIAGNOSIS — R1031 Right lower quadrant pain: Secondary | ICD-10-CM | POA: Diagnosis not present

## 2018-05-04 DIAGNOSIS — Z3A26 26 weeks gestation of pregnancy: Secondary | ICD-10-CM | POA: Diagnosis not present

## 2018-05-04 DIAGNOSIS — R109 Unspecified abdominal pain: Secondary | ICD-10-CM

## 2018-05-04 DIAGNOSIS — O9989 Other specified diseases and conditions complicating pregnancy, childbirth and the puerperium: Secondary | ICD-10-CM | POA: Diagnosis present

## 2018-05-04 DIAGNOSIS — O26892 Other specified pregnancy related conditions, second trimester: Secondary | ICD-10-CM | POA: Diagnosis present

## 2018-05-04 LAB — CBC WITH DIFFERENTIAL/PLATELET
BASOS ABS: 0 10*3/uL (ref 0.0–0.1)
BASOS PCT: 0 %
EOS PCT: 1 %
Eosinophils Absolute: 0.1 10*3/uL (ref 0.0–0.7)
HCT: 29.5 % — ABNORMAL LOW (ref 36.0–46.0)
Hemoglobin: 9.9 g/dL — ABNORMAL LOW (ref 12.0–15.0)
Lymphocytes Relative: 18 %
Lymphs Abs: 2.6 10*3/uL (ref 0.7–4.0)
MCH: 27.6 pg (ref 26.0–34.0)
MCHC: 33.6 g/dL (ref 30.0–36.0)
MCV: 82.2 fL (ref 78.0–100.0)
MONO ABS: 0.4 10*3/uL (ref 0.1–1.0)
MONOS PCT: 3 %
Neutro Abs: 11.1 10*3/uL — ABNORMAL HIGH (ref 1.7–7.7)
Neutrophils Relative %: 78 %
PLATELETS: 213 10*3/uL (ref 150–400)
RBC: 3.59 MIL/uL — ABNORMAL LOW (ref 3.87–5.11)
RDW: 13.3 % (ref 11.5–15.5)
WBC: 14.2 10*3/uL — ABNORMAL HIGH (ref 4.0–10.5)

## 2018-05-04 LAB — URINALYSIS, ROUTINE W REFLEX MICROSCOPIC
BACTERIA UA: NONE SEEN
BILIRUBIN URINE: NEGATIVE
Glucose, UA: NEGATIVE mg/dL
Hgb urine dipstick: NEGATIVE
KETONES UR: NEGATIVE mg/dL
Nitrite: NEGATIVE
PROTEIN: NEGATIVE mg/dL
SPECIFIC GRAVITY, URINE: 1.004 — AB (ref 1.005–1.030)
pH: 6 (ref 5.0–8.0)

## 2018-05-04 MED ORDER — DIPHENHYDRAMINE HCL 50 MG/ML IJ SOLN
25.0000 mg | Freq: Once | INTRAMUSCULAR | Status: AC
  Administered 2018-05-04: 25 mg via INTRAVENOUS
  Filled 2018-05-04: qty 1

## 2018-05-04 MED ORDER — NIFEDIPINE 10 MG PO CAPS
10.0000 mg | ORAL_CAPSULE | Freq: Once | ORAL | Status: AC
  Administered 2018-05-04: 10 mg via ORAL
  Filled 2018-05-04: qty 1

## 2018-05-04 NOTE — MAU Note (Addendum)
Pt sent from MD office, was seen on Friday for UTI, had an allergic reaction to the antibiotic, last took it last night.  Has had a fever since Sunday, also vomiting, has RLQ & R shoulder & R upper back pain.  Denies diarrhea.  Has vaginal swelling.  Had temp of 101.3 earlier today, has been taking tylenol.

## 2018-05-04 NOTE — MAU Provider Note (Signed)
History     Chief Complaint  Patient presents with  . Fever  . Abdominal Pain  . Shoulder Pain  . Back Pain   27 yo G3P0020 SBF @ 26 1/[redacted] weeks gestation sent from the office for evaluation of appendicitis due to c/o Right sided abdominal pain since Sunday. (+) nausea/vomiting since Sunday and fever to 101.6. Pt took tylenol prior to coming to office. C/o feels weak on the right. Pt was seen last week for premature contractions and was diagnosed with GBS bacteruria. Pt was started on keflex which did not take today due to c/o vulvar/vaginal swelling  OB History    Gravida  3   Para      Term      Preterm      AB  2   Living  0     SAB  2   TAB      Ectopic      Multiple      Live Births              Past Medical History:  Diagnosis Date  . Hyperlipidemia   . Hypertension     Past Surgical History:  Procedure Laterality Date  . NO PAST SURGERIES      History reviewed. No pertinent family history.  Social History   Tobacco Use  . Smoking status: Never Smoker  . Smokeless tobacco: Never Used  Substance Use Topics  . Alcohol use: No  . Drug use: No    Allergies:  Allergies  Allergen Reactions  . Toradol [Ketorolac Tromethamine] Hives  . Amoxicillin Hives    Has patient had a PCN reaction causing immediate rash, facial/tongue/throat swelling, SOB or lightheadedness with hypotension: Yes Has patient had a PCN reaction causing severe rash involving mucus membranes or skin necrosis: Unk Has patient had a PCN reaction that required hospitalization: Yes Has patient had a PCN reaction occurring within the last 10 years: Yes If all of the above answers are "NO", then may proceed with Cephalosporin use.   Marland Kitchen. Peach [Prunus Persica] Hives    Medications Prior to Admission  Medication Sig Dispense Refill Last Dose  . Prenatal Vit-Fe Fumarate-FA (PRENATAL VITAMIN PLUS LOW IRON) 27-1 MG TABS Take 1 tablet by mouth daily. 30 tablet 2 03/02/2018 at Unknown  time     Physical Exam   Blood pressure (!) 125/93, pulse (!) 103, temperature 98.7 F (37.1 C), temperature source Oral, resp. rate 18, height 5' (1.524 m), weight 69.9 kg (154 lb), last menstrual period 11/01/2017.  General appearance: alert, cooperative and no distress Back: no tenderness to percussion or palpation Lungs: clear to auscultation bilaterally Heart: regular rate and rhythm, S1, S2 normal, no murmur, click, rub or gallop Abdomen: gravid, tender right mid lateral to uterus Pelvic: external genitalia normal and cervix closed/long/OOP. scant white d/c vulva nl clitoral ring Extremities: no edema, redness or tenderness in the calves or thighs  Tracing baseline 150 (+) irreg ctx  ED Course  IMP: right sided abdominal pain with associated fever, n/v R/o appendicitis IUP@ 26  1/7 PMC P) cbc with diff, MRI of abdomen. Cont fetal monitor. U/a neg. Procardia 10mg  po x 1 MDM  addendum: CBC    Component Value Date/Time   WBC 14.2 (H) 05/04/2018 1723   RBC 3.59 (L) 05/04/2018 1723   HGB 9.9 (L) 05/04/2018 1723   HGB 12.0 08/29/2013 1002   HCT 29.5 (L) 05/04/2018 1723   HCT 36.4 08/29/2013 1002   PLT  213 05/04/2018 1723   PLT 200 08/29/2013 1002   MCV 82.2 05/04/2018 1723   MCV 83 08/29/2013 1002   MCH 27.6 05/04/2018 1723   MCHC 33.6 05/04/2018 1723   RDW 13.3 05/04/2018 1723   RDW 13.2 08/29/2013 1002   LYMPHSABS 2.6 05/04/2018 1723   MONOABS 0.4 05/04/2018 1723   EOSABS 0.1 05/04/2018 1723   BASOSABS 0.0 05/04/2018 1723  mRi sched for 8:15pm  Serita Kyle, MD 7:20 PM 05/04/2018

## 2018-05-05 ENCOUNTER — Other Ambulatory Visit: Payer: Self-pay

## 2018-05-05 ENCOUNTER — Encounter (HOSPITAL_COMMUNITY): Payer: Self-pay | Admitting: *Deleted

## 2018-05-05 DIAGNOSIS — R109 Unspecified abdominal pain: Secondary | ICD-10-CM

## 2018-05-05 DIAGNOSIS — O26892 Other specified pregnancy related conditions, second trimester: Secondary | ICD-10-CM | POA: Diagnosis present

## 2018-05-05 LAB — CBC
HCT: 28.7 % — ABNORMAL LOW (ref 36.0–46.0)
Hemoglobin: 9.7 g/dL — ABNORMAL LOW (ref 12.0–15.0)
MCH: 27.9 pg (ref 26.0–34.0)
MCHC: 33.8 g/dL (ref 30.0–36.0)
MCV: 82.5 fL (ref 78.0–100.0)
Platelets: 200 10*3/uL (ref 150–400)
RBC: 3.48 MIL/uL — AB (ref 3.87–5.11)
RDW: 13.4 % (ref 11.5–15.5)
WBC: 12.9 10*3/uL — ABNORMAL HIGH (ref 4.0–10.5)

## 2018-05-05 MED ORDER — ERYTHROMYCIN BASE 250 MG PO TABS
500.0000 mg | ORAL_TABLET | Freq: Four times a day (QID) | ORAL | Status: DC
  Administered 2018-05-05: 500 mg via ORAL
  Filled 2018-05-05 (×4): qty 2

## 2018-05-05 MED ORDER — NIFEDIPINE 10 MG PO CAPS
10.0000 mg | ORAL_CAPSULE | Freq: Four times a day (QID) | ORAL | Status: DC
  Administered 2018-05-05 (×3): 10 mg via ORAL
  Filled 2018-05-05 (×4): qty 1

## 2018-05-05 MED ORDER — PRENATAL MULTIVITAMIN CH
1.0000 | ORAL_TABLET | Freq: Every day | ORAL | Status: DC
  Administered 2018-05-05: 1 via ORAL
  Filled 2018-05-05 (×2): qty 1

## 2018-05-05 MED ORDER — DIPHENHYDRAMINE HCL 50 MG/ML IJ SOLN
25.0000 mg | Freq: Once | INTRAMUSCULAR | Status: AC
  Administered 2018-05-05: 25 mg via INTRAVENOUS
  Filled 2018-05-05: qty 1

## 2018-05-05 MED ORDER — BUTORPHANOL TARTRATE 1 MG/ML IJ SOLN
1.0000 mg | Freq: Once | INTRAMUSCULAR | Status: AC
  Administered 2018-05-05: 1 mg via INTRAVENOUS
  Filled 2018-05-05: qty 1

## 2018-05-05 MED ORDER — ERYTHROMYCIN BASE 500 MG PO TABS: 500.0000 mg | ORAL_TABLET | Freq: Four times a day (QID) | ORAL | 7 refills | Status: DC

## 2018-05-05 MED ORDER — ZOLPIDEM TARTRATE 5 MG PO TABS: 5.0000 mg | ORAL_TABLET | Freq: Every evening | ORAL | Status: DC | PRN

## 2018-05-05 MED ORDER — DEXTROSE IN LACTATED RINGERS 5 % IV SOLN
INTRAVENOUS | Status: DC
  Administered 2018-05-05 (×2): via INTRAVENOUS

## 2018-05-05 MED ORDER — NIFEDIPINE 10 MG PO CAPS
20.0000 mg | ORAL_CAPSULE | Freq: Once | ORAL | Status: AC
  Administered 2018-05-05: 20 mg via ORAL
  Filled 2018-05-05: qty 2

## 2018-05-05 MED ORDER — ACETAMINOPHEN 500 MG PO TABS
1000.0000 mg | ORAL_TABLET | Freq: Once | ORAL | Status: AC
  Administered 2018-05-05: 1000 mg via ORAL
  Filled 2018-05-05: qty 2

## 2018-05-05 NOTE — Discharge Instructions (Signed)
Preterm labor precautions

## 2018-05-05 NOTE — Progress Notes (Signed)
Pt d/c home with  Family escorted out by NT. Pt in stable condition. D/c instructions explained and cope given to pt, verbalize understanding no questions

## 2018-05-05 NOTE — Progress Notes (Signed)
S: (+) FM  O; BP 123/88 (BP Location: Right Arm)   Pulse (!) 108   Temp 98.8 F (37.1 C) (Oral)   Resp 18   Ht 5' (1.524 m)   Wt 70 kg (154 lb 4 oz)   LMP 11/01/2017 (Exact Date)   SpO2 100%   BMI 30.12 kg/m  VE: long/closed/OOP Abd soft gravid non tender  Tracing: baseline 135 (+) accel Mr Pelvis Wo Contrast  Result Date: 05/04/2018 CLINICAL DATA:  Pregnant, abdominal pain, fever, evaluate for appendicitis EXAM: MRI ABDOMEN AND PELVIS WITHOUT CONTRAST TECHNIQUE: Multiplanar multisequence MR imaging of the abdomen and pelvis was performed. No intravenous contrast was administered. COMPARISON:  CT abdomen/pelvis dated 09/03/2018. FINDINGS: Lower chest: Not imaged. Hepatobiliary: Visualized liver is unremarkable. Pancreas:  Visualized pancreas is unremarkable. Spleen:  Incompletely imaged but unremarkable. Adrenals/Urinary Tract:  Adrenal glands are unremarkable. Kidneys are within normal limits, noting mild fullness of the bilateral renal collecting systems, right greater than left. This is likely physiologic related to pregnancy. Bladder is within normal limits. Stomach/Bowel: Stomach and visualized bowel is grossly unremarkable. Appendix is not discretely visualized. Regardless, there are no inflammatory changes in the right lower quadrant to suggest acute appendicitis. No evidence of bowel obstruction. Vascular/Lymphatic:  No evidence of abdominal aortic aneurysm. No suspicious abdominopelvic lymphadenopathy. Reproductive: Gravid uterus. Cephalic presentation. Dedicated fetal evaluation is not performed. Placenta is posterior and free of the cervical os. Bilateral ovaries are unremarkable. Other:  No abdominopelvic ascites. Musculoskeletal: No focal osseous lesions. IMPRESSION: Appendix is not discretely visualized but there are no findings to suggest acute appendicitis. Gravid uterus. Cephalic presentation. Placenta is posterior and free of the cervical os. Electronically Signed   By: Charline Bills M.D.   On: 05/04/2018 21:58   Mr Abdomen Wo Contrast  Result Date: 05/04/2018 CLINICAL DATA:  Pregnant, abdominal pain, fever, evaluate for appendicitis EXAM: MRI ABDOMEN AND PELVIS WITHOUT CONTRAST TECHNIQUE: Multiplanar multisequence MR imaging of the abdomen and pelvis was performed. No intravenous contrast was administered. COMPARISON:  CT abdomen/pelvis dated 09/03/2018. FINDINGS: Lower chest: Not imaged. Hepatobiliary: Visualized liver is unremarkable. Pancreas:  Visualized pancreas is unremarkable. Spleen:  Incompletely imaged but unremarkable. Adrenals/Urinary Tract:  Adrenal glands are unremarkable. Kidneys are within normal limits, noting mild fullness of the bilateral renal collecting systems, right greater than left. This is likely physiologic related to pregnancy. Bladder is within normal limits. Stomach/Bowel: Stomach and visualized bowel is grossly unremarkable. Appendix is not discretely visualized. Regardless, there are no inflammatory changes in the right lower quadrant to suggest acute appendicitis. No evidence of bowel obstruction. Vascular/Lymphatic:  No evidence of abdominal aortic aneurysm. No suspicious abdominopelvic lymphadenopathy. Reproductive: Gravid uterus. Cephalic presentation. Dedicated fetal evaluation is not performed. Placenta is posterior and free of the cervical os. Bilateral ovaries are unremarkable. Other:  No abdominopelvic ascites. Musculoskeletal: No focal osseous lesions. IMPRESSION: Appendix is not discretely visualized but there are no findings to suggest acute appendicitis. Gravid uterus. Cephalic presentation. Placenta is posterior and free of the cervical os. Electronically Signed   By: Charline Bills M.D.   On: 05/04/2018 21:58   CBC    Component Value Date/Time   WBC 12.9 (H) 05/05/2018 0537   RBC 3.48 (L) 05/05/2018 0537   HGB 9.7 (L) 05/05/2018 0537   HGB 12.0 08/29/2013 1002   HCT 28.7 (L) 05/05/2018 0537   HCT 36.4 08/29/2013 1002    PLT 200 05/05/2018 0537   PLT 200 08/29/2013 1002   MCV 82.5 05/05/2018 0537  MCV 83 08/29/2013 1002   MCH 27.9 05/05/2018 0537   MCHC 33.8 05/05/2018 0537   RDW 13.4 05/05/2018 0537   RDW 13.2 08/29/2013 1002   LYMPHSABS 2.6 05/04/2018 1723   MONOABS 0.4 05/04/2018 1723   EOSABS 0.1 05/04/2018 1723   BASOSABS 0.0 05/04/2018 1723    IMP: PMC IUP@ 27 3/7 weeks  Right sided abdominal pain w/o evidence of appendicitis. Wbc sl decreased P) d/c home. F/u office 1 wk. PTL prec reviewed

## 2018-05-05 NOTE — Progress Notes (Signed)
Dr. Cherly Hensenousins on phone updated pt c/o constant lower abd pain cramping.   abd palpates soft.  occas Uc with occas UI.  Baby very active.  Orders rec.

## 2018-05-05 NOTE — Plan of Care (Signed)
  Problem: Education: Goal: Knowledge of General Education information will improve Description Including pain rating scale, medication(s)/side effects and non-pharmacologic comfort measures Outcome: Completed/Met

## 2018-05-10 ENCOUNTER — Other Ambulatory Visit: Payer: Self-pay | Admitting: Obstetrics and Gynecology

## 2018-06-17 ENCOUNTER — Encounter (HOSPITAL_COMMUNITY): Payer: Self-pay

## 2018-06-17 ENCOUNTER — Other Ambulatory Visit: Payer: Self-pay

## 2018-06-17 ENCOUNTER — Inpatient Hospital Stay (HOSPITAL_COMMUNITY)
Admission: AD | Admit: 2018-06-17 | Discharge: 2018-06-17 | Disposition: A | Discharge status: Home / Self Care | Payer: 59 | Source: Ambulatory Visit | Attending: Obstetrics and Gynecology | Admitting: Obstetrics and Gynecology

## 2018-06-17 DIAGNOSIS — N898 Other specified noninflammatory disorders of vagina: Secondary | ICD-10-CM | POA: Insufficient documentation

## 2018-06-17 DIAGNOSIS — Z3A32 32 weeks gestation of pregnancy: Secondary | ICD-10-CM | POA: Diagnosis not present

## 2018-06-17 DIAGNOSIS — O26893 Other specified pregnancy related conditions, third trimester: Secondary | ICD-10-CM | POA: Diagnosis not present

## 2018-06-17 DIAGNOSIS — O4290 Premature rupture of membranes, unspecified as to length of time between rupture and onset of labor, unspecified weeks of gestation: Secondary | ICD-10-CM

## 2018-06-17 DIAGNOSIS — Z79899 Other long term (current) drug therapy: Secondary | ICD-10-CM | POA: Insufficient documentation

## 2018-06-17 LAB — WET PREP, GENITAL
CLUE CELLS WET PREP: NONE SEEN
Sperm: NONE SEEN
Trich, Wet Prep: NONE SEEN

## 2018-06-17 LAB — POCT FERN TEST: POCT Fern Test: NEGATIVE

## 2018-06-17 LAB — AMNISURE RUPTURE OF MEMBRANE (ROM) NOT AT ARMC: AMNISURE: NEGATIVE

## 2018-06-17 NOTE — MAU Note (Signed)
Was at work, started leaking, no longer feeling it. Clear fluid, ? D/c color. No bleeding  Feeling like braxton hicks

## 2018-06-17 NOTE — MAU Provider Note (Signed)
History    Cc: leaking fluid 27 yo G3P0020 BF @  32 4/[redacted] wks gestation presents with complaint of leaking fluid ( clear ) started 1 1/2 hr prior to arrival. Denies abdominal pain. (+) FM. Was just seen in the office yesterday with c/o lost mucus plug````````````````````````````````````````````````````````````````````````````````````````````````````````````````````````````````````````````````````````````   OB History    Gravida  3   Para      Term      Preterm      AB  2   Living  0     SAB  2   TAB      Ectopic      Multiple      Live Births              Past Medical History:  Diagnosis Date  . Hyperlipidemia   . Hypertension     Past Surgical History:  Procedure Laterality Date  . NO PAST SURGERIES      History reviewed. No pertinent family history.  Social History   Tobacco Use  . Smoking status: Never Smoker  . Smokeless tobacco: Never Used  Substance Use Topics  . Alcohol use: No  . Drug use: No    Allergies:  Allergies  Allergen Reactions  . Toradol [Ketorolac Tromethamine] Hives  . Amoxicillin Hives    Has patient had a PCN reaction causing immediate rash, facial/tongue/throat swelling, SOB or lightheadedness with hypotension: Yes Has patient had a PCN reaction causing severe rash involving mucus membranes or skin necrosis: Unk Has patient had a PCN reaction that required hospitalization: Yes Has patient had a PCN reaction occurring within the last 10 years: Yes If all of the above answers are "NO", then may proceed with Cephalosporin use.   Marland Kitchen. Peach [Prunus Persica] Hives    Medications Prior to Admission  Medication Sig Dispense Refill Last Dose  . erythromycin (E-MYCIN) 500 MG tablet Take 1 tablet (500 mg total) by mouth 4 (four) times daily. 28 tablet 7   . Prenatal Vit-Fe Fumarate-FA (PRENATAL VITAMIN PLUS LOW IRON) 27-1 MG TABS Take 1 tablet by mouth daily. 30 tablet 2 03/02/2018 at Unknown time     Physical Exam   Blood  pressure 125/82, pulse (!) 104, temperature 98.5 F (36.9 C), temperature source Oral, resp. rate 17, weight 71.8 kg, last menstrual period 11/01/2017, SpO2 100 %.  General appearance: alert, cooperative and no distress Abdomen: gravid nontender Pelvic: cervix normal in appearance, external genitalia normal and vagina clumpy  d/c. fern  and amnio sure done. wet prep done  cervix closed/long. no fluid with coughing Extremities: no edema, redness or tenderness in the calves or thighs  Tracing: baseline 130 (+) accel to 150 No ctx ED Course  Leaking fluid Vaginal discharge prob candida P) fern. amnio sure if fern negative. Wet prep MDM  Addendum: fern neg Amni sure neg Wet prep . Candida  d/c home. Pt has refill scripts for terazol-7 Keep scheduled appts Serita KyleSheronette A Camellia Popescu, MD 3:42 PM 06/17/2018

## 2018-06-17 NOTE — Discharge Instructions (Signed)
PTL precaution

## 2018-07-22 ENCOUNTER — Encounter (HOSPITAL_COMMUNITY): Payer: Self-pay

## 2018-07-22 ENCOUNTER — Inpatient Hospital Stay (HOSPITAL_COMMUNITY)
Admission: AD | Admit: 2018-07-22 | Discharge: 2018-07-28 | DRG: 786 | Disposition: A | Discharge status: Home / Self Care | Payer: 59 | Attending: Obstetrics and Gynecology | Admitting: Obstetrics and Gynecology

## 2018-07-22 ENCOUNTER — Other Ambulatory Visit: Payer: Self-pay

## 2018-07-22 ENCOUNTER — Inpatient Hospital Stay (HOSPITAL_BASED_OUTPATIENT_CLINIC_OR_DEPARTMENT_OTHER): Payer: 59

## 2018-07-22 DIAGNOSIS — Z91018 Allergy to other foods: Secondary | ICD-10-CM

## 2018-07-22 DIAGNOSIS — Z3A37 37 weeks gestation of pregnancy: Secondary | ICD-10-CM

## 2018-07-22 DIAGNOSIS — O411230 Chorioamnionitis, third trimester, not applicable or unspecified: Secondary | ICD-10-CM | POA: Diagnosis present

## 2018-07-22 DIAGNOSIS — O133 Gestational [pregnancy-induced] hypertension without significant proteinuria, third trimester: Secondary | ICD-10-CM | POA: Diagnosis present

## 2018-07-22 DIAGNOSIS — O1002 Pre-existing essential hypertension complicating childbirth: Secondary | ICD-10-CM | POA: Diagnosis present

## 2018-07-22 DIAGNOSIS — O289 Unspecified abnormal findings on antenatal screening of mother: Secondary | ICD-10-CM | POA: Diagnosis not present

## 2018-07-22 DIAGNOSIS — D62 Acute posthemorrhagic anemia: Secondary | ICD-10-CM | POA: Diagnosis not present

## 2018-07-22 DIAGNOSIS — O9081 Anemia of the puerperium: Secondary | ICD-10-CM | POA: Diagnosis not present

## 2018-07-22 DIAGNOSIS — Z880 Allergy status to penicillin: Secondary | ICD-10-CM

## 2018-07-22 DIAGNOSIS — O288 Other abnormal findings on antenatal screening of mother: Secondary | ICD-10-CM | POA: Diagnosis present

## 2018-07-22 DIAGNOSIS — O411290 Chorioamnionitis, unspecified trimester, not applicable or unspecified: Secondary | ICD-10-CM | POA: Diagnosis not present

## 2018-07-22 DIAGNOSIS — O471 False labor at or after 37 completed weeks of gestation: Secondary | ICD-10-CM

## 2018-07-22 DIAGNOSIS — O99824 Streptococcus B carrier state complicating childbirth: Secondary | ICD-10-CM | POA: Diagnosis present

## 2018-07-22 LAB — COMPREHENSIVE METABOLIC PANEL
ALT: 10 U/L (ref 0–44)
ANION GAP: 7 (ref 5–15)
AST: 17 U/L (ref 15–41)
Albumin: 2.5 g/dL — ABNORMAL LOW (ref 3.5–5.0)
Alkaline Phosphatase: 92 U/L (ref 38–126)
BUN: 10 mg/dL (ref 6–20)
CHLORIDE: 108 mmol/L (ref 98–111)
CO2: 19 mmol/L — AB (ref 22–32)
Calcium: 8.9 mg/dL (ref 8.9–10.3)
Creatinine, Ser: 0.74 mg/dL (ref 0.44–1.00)
GFR calc non Af Amer: >60 mL/min (ref >60)
Glucose, Bld: 80 mg/dL (ref 70–99)
POTASSIUM: 4 mmol/L (ref 3.5–5.1)
SODIUM: 134 mmol/L — AB (ref 135–145)
Total Bilirubin: 0.4 mg/dL (ref 0.3–1.2)
Total Protein: 5.8 g/dL — ABNORMAL LOW (ref 6.5–8.1)

## 2018-07-22 LAB — CBC
HCT: 31.8 % — ABNORMAL LOW (ref 36.0–46.0)
HEMATOCRIT: 29.1 % — AB (ref 36.0–46.0)
Hemoglobin: 10 g/dL — ABNORMAL LOW (ref 12.0–15.0)
Hemoglobin: 9.4 g/dL — ABNORMAL LOW (ref 12.0–15.0)
MCH: 24 pg — AB (ref 26.0–34.0)
MCH: 24.7 pg — AB (ref 26.0–34.0)
MCHC: 31.4 g/dL (ref 30.0–36.0)
MCHC: 32.3 g/dL (ref 30.0–36.0)
MCV: 76.3 fL — AB (ref 80.0–100.0)
MCV: 76.6 fL — ABNORMAL LOW (ref 80.0–100.0)
PLATELETS: 237 10*3/uL (ref 150–400)
Platelets: 225 10*3/uL (ref 150–400)
RBC: 4.17 MIL/uL (ref 3.87–5.11)
RBC: 3.8 MIL/uL — ABNORMAL LOW (ref 3.87–5.11)
RDW: 14.5 % (ref 11.5–15.5)
RDW: 14.7 % (ref 11.5–15.5)
WBC: 14.5 10*3/uL — ABNORMAL HIGH (ref 4.0–10.5)
WBC: 13 10*3/uL — AB (ref 4.0–10.5)
nRBC: 0 % (ref 0.0–0.2)
nRBC: 0 % (ref 0.0–0.2)

## 2018-07-22 LAB — TYPE AND SCREEN
ABO/RH(D): A POS
ANTIBODY SCREEN: NEGATIVE

## 2018-07-22 LAB — ABO/RH: ABO/RH(D): A POS

## 2018-07-22 LAB — PROTEIN / CREATININE RATIO, URINE
CREATININE, URINE: 210 mg/dL
Protein Creatinine Ratio: 0.09 mg/mg{Cre} (ref 0.00–0.15)
TOTAL PROTEIN, URINE: 19 mg/dL

## 2018-07-22 LAB — OB RESULTS CONSOLE GBS: GBS: POSITIVE

## 2018-07-22 LAB — RPR: RPR: NONREACTIVE

## 2018-07-22 LAB — URIC ACID: Uric Acid, Serum: 4.5 mg/dL (ref 2.5–7.1)

## 2018-07-22 MED ORDER — BUTORPHANOL TARTRATE 1 MG/ML IJ SOLN
1.0000 mg | INTRAMUSCULAR | Status: DC | PRN
  Administered 2018-07-22 – 2018-07-23 (×3): 1 mg via INTRAVENOUS
  Filled 2018-07-22 (×3): qty 1

## 2018-07-22 MED ORDER — OXYCODONE-ACETAMINOPHEN 5-325 MG PO TABS: 1.0000 | ORAL_TABLET | ORAL | Status: DC | PRN

## 2018-07-22 MED ORDER — VANCOMYCIN HCL IN DEXTROSE 1-5 GM/200ML-% IV SOLN
1000.0000 mg | Freq: Two times a day (BID) | INTRAVENOUS | Status: DC
  Administered 2018-07-22 – 2018-07-23 (×3): 1000 mg via INTRAVENOUS
  Filled 2018-07-22 (×3): qty 200

## 2018-07-22 MED ORDER — ONDANSETRON HCL 4 MG/2ML IJ SOLN
4.0000 mg | Freq: Four times a day (QID) | INTRAMUSCULAR | Status: DC | PRN
  Administered 2018-07-23: 4 mg via INTRAVENOUS
  Filled 2018-07-22: qty 2

## 2018-07-22 MED ORDER — SOD CITRATE-CITRIC ACID 500-334 MG/5ML PO SOLN
30.0000 mL | ORAL | Status: DC | PRN
  Administered 2018-07-23: 30 mL via ORAL
  Filled 2018-07-22: qty 15

## 2018-07-22 MED ORDER — MISOPROSTOL 50MCG HALF TABLET
50.0000 ug | ORAL_TABLET | ORAL | Status: DC
  Administered 2018-07-22 – 2018-07-23 (×3): 50 ug via ORAL
  Filled 2018-07-22 (×6): qty 1

## 2018-07-22 MED ORDER — ACETAMINOPHEN 325 MG PO TABS
650.0000 mg | ORAL_TABLET | ORAL | Status: DC | PRN
  Administered 2018-07-23: 650 mg via ORAL
  Filled 2018-07-22: qty 2

## 2018-07-22 MED ORDER — TERBUTALINE SULFATE 1 MG/ML IJ SOLN: 0.2500 mg | Freq: Once | INTRAMUSCULAR | Status: DC | PRN

## 2018-07-22 MED ORDER — CALCIUM CARBONATE ANTACID 500 MG PO CHEW: 2.0000 | CHEWABLE_TABLET | ORAL | Status: DC | PRN

## 2018-07-22 MED ORDER — LACTATED RINGERS IV SOLN
INTRAVENOUS | Status: DC
  Administered 2018-07-22 – 2018-07-23 (×8): via INTRAVENOUS

## 2018-07-22 MED ORDER — PRENATAL MULTIVITAMIN CH
1.0000 | ORAL_TABLET | Freq: Every day | ORAL | Status: DC
  Administered 2018-07-22: 1 via ORAL
  Filled 2018-07-22: qty 1

## 2018-07-22 MED ORDER — LACTATED RINGERS IV SOLN
500.0000 mL | INTRAVENOUS | Status: DC | PRN
  Administered 2018-07-22 – 2018-07-23 (×2): 500 mL via INTRAVENOUS
  Administered 2018-07-23: 1000 mL via INTRAVENOUS
  Administered 2018-07-23: 500 mL via INTRAVENOUS
  Administered 2018-07-23 (×2): 1000 mL via INTRAVENOUS

## 2018-07-22 MED ORDER — OXYCODONE-ACETAMINOPHEN 5-325 MG PO TABS: 2.0000 | ORAL_TABLET | ORAL | Status: DC | PRN

## 2018-07-22 MED ORDER — SODIUM CHLORIDE 0.9 % IV SOLN: 5.0000 10*6.[IU] | Freq: Once | INTRAVENOUS | Status: DC

## 2018-07-22 MED ORDER — LIDOCAINE HCL (PF) 1 % IJ SOLN: 30.0000 mL | INTRAMUSCULAR | Status: DC | PRN

## 2018-07-22 MED ORDER — CLINDAMYCIN PHOSPHATE 900 MG/50ML IV SOLN: 900.0000 mg | Freq: Three times a day (TID) | INTRAVENOUS | Status: DC

## 2018-07-22 MED ORDER — OXYTOCIN BOLUS FROM INFUSION: 500.0000 mL | Freq: Once | INTRAVENOUS | Status: DC

## 2018-07-22 MED ORDER — OXYTOCIN 10 UNIT/ML IJ SOLN: 10.0000 [IU] | Freq: Once | INTRAMUSCULAR | Status: DC

## 2018-07-22 MED ORDER — ACETAMINOPHEN 325 MG PO TABS
650.0000 mg | ORAL_TABLET | ORAL | Status: DC | PRN
  Administered 2018-07-22: 650 mg via ORAL
  Filled 2018-07-22: qty 2

## 2018-07-22 MED ORDER — OXYTOCIN 40 UNITS IN LACTATED RINGERS INFUSION - SIMPLE MED: 2.5000 [IU]/h | INTRAVENOUS | Status: DC

## 2018-07-22 MED ORDER — PENICILLIN G 3 MILLION UNITS IVPB - SIMPLE MED: 3.0000 10*6.[IU] | INTRAVENOUS | Status: DC

## 2018-07-22 MED ORDER — DOCUSATE SODIUM 100 MG PO CAPS
100.0000 mg | ORAL_CAPSULE | Freq: Every day | ORAL | Status: DC
  Filled 2018-07-22: qty 1

## 2018-07-22 NOTE — Progress Notes (Addendum)
S; (+) FM irreg ctx  O: BP (!) 124/94 (BP Location: Left Arm)   Pulse 95   Temp 99 F (37.2 C) (Oral)   Resp 18   Ht 5\' 1"  (1.549 m)   Wt 76.7 kg   LMP 11/01/2017 (Exact Date)   SpO2 98%   BMI 31.94 kg/m  Patient Vitals for the past 24 hrs:  BP Temp Temp src Pulse Resp SpO2 Height Weight  07/22/18 1201 (!) 124/94 99 F (37.2 C) Oral 95 18 98 % - -  07/22/18 0812 (!) 130/102 98.5 F (36.9 C) Oral 89 18 100 % - -  07/22/18 0429 (!) 129/93 98.9 F (37.2 C) Oral 86 17 100 % - -  07/22/18 0121 124/87 98.9 F (37.2 C) - 96 17 98 % 5\' 1"  (1.549 m) 76.7 kg   VE closed/70/-3 posterior  Tracing: baseline 130 (+) accel to 145-150 irreg ctx  IMP: Reassuring fetal testing Elevated BP P) [facility] labs. Serial BP    Addendum: persistent elev BP. [facility] labs nl  will transfer for IOL

## 2018-07-22 NOTE — Progress Notes (Signed)
S. No complaint  O. Patient Vitals for the past 24 hrs:  BP Temp Temp src Pulse Resp SpO2 Height Weight  07/22/18 1825 137/89 - - 97 18 - - -  07/22/18 1647 (!) 141/105 98.8 F (37.1 C) Oral (!) 107 18 - - -  07/22/18 1623 (!) 145/102 - - (!) 109 18 - - -  07/22/18 1516 (!) 138/95 97.8 F (36.6 C) Oral (!) 107 18 100 % - -  07/22/18 1409 (!) 133/93 - - 93 - - - -  07/22/18 1234 (!) 130/97 - - 95 - - - -  07/22/18 1219 128/90 - - 90 16 - - -  07/22/18 1201 (!) 124/94 99 F (37.2 C) Oral 95 18 98 % - -  07/22/18 0812 (!) 130/102 98.5 F (36.9 C) Oral 89 18 100 % - -  07/22/18 0429 (!) 129/93 98.9 F (37.2 C) Oral 86 17 100 % - -  07/22/18 0121 124/87 98.9 F (37.2 C) - 96 17 98 % 5\' 1"  (1.549 m) 76.7 kg  VE tight 1/60/-3  Intracervical balloon placed  Tracing: baseline 140 (+) accel irreg ctx  IMP: gestational HTN GBS cx (+) on Vancomycin IUP@ 37 4/7 weeks P) oral cytotec. Intracervical balloon. Pitocin prn

## 2018-07-22 NOTE — H&P (Signed)
Krista Rodgers is a 27 y.o. female presenting for observation due to NR NST after presenting for labor evaluation. sono done; BPP 8/8. Intact membrane  OB History    Gravida  3   Para      Term      Preterm      AB  2   Living  0     SAB  2   TAB      Ectopic      Multiple      Live Births             Past Medical History:  Diagnosis Date  . Hyperlipidemia   . Hypertension    Past Surgical History:  Procedure Laterality Date  . NO PAST SURGERIES     Family History: family history is not on file. Social History:  reports that she has never smoked. She has never used smokeless tobacco. She reports that she does not drink alcohol or use drugs.     Maternal Diabetes: No Genetic Screening: Normal Maternal Ultrasounds/Referrals: Normal Fetal Ultrasounds or other Referrals:  None Maternal Substance Abuse:  No Significant Maternal Medications:  None Significant Maternal Lab Results:  Lab values include: Group B Strep positive Other Comments:  None  Review of Systems  All other systems reviewed and are negative.  History Dilation: Fingertip Effacement (%): Thick Exam by:: Camelia Eng, RNC Blood pressure (!) 124/94, pulse 95, temperature 99 F (37.2 C), temperature source Oral, resp. rate 18, height 5\' 1"  (1.549 m), weight 76.7 kg, last menstrual period 11/01/2017, SpO2 98 %. Exam Physical Exam  Constitutional: She is oriented to person, place, and time. She appears well-developed and well-nourished.  Eyes: EOM are normal.  Neck: Neck supple.  Cardiovascular: Regular rhythm.  Respiratory: Effort normal.  GI: Soft.  Musculoskeletal: She exhibits no edema.  Neurological: She is alert and oriented to person, place, and time.  Skin: Skin is warm and dry.  Psychiatric: She has a normal mood and affect.    Prenatal labs: ABO, Rh:  A positive Antibody:  negative Rubella:  Immune RPR:   NR HBsAg:   neg HIV: Non Reactive (03/05 1602)  GBS:    positive [place] Mfm Fetal Bpp Wo Non Stress  Result Date: 07/22/2018 ----------------------------------------------------------------------  OBSTETRICS REPORT                       (Signed Final 07/22/2018 08:37 am) ---------------------------------------------------------------------- Patient Info  ID #:       [id number]                          D.O.B.:  01/26/1991 (27 yrs)  Name:       Krista Rodgers                  Visit Date: 07/22/2018 02:51 am ---------------------------------------------------------------------- Performed By  Performed By:     Tomma Lightning             Ref. Address:     [facility]                    RDMS,RVT  [facility]                                                             [facility]                                                             [address]                                                             [place], [place]                                                             [address]  Attending:        Noralee Space MD        Location:         [facility]  Referred By:      Nena Jordan                    Ozell Ferrera MD ---------------------------------------------------------------------- Orders   #  Description                          Code         Ordered By   1  [place] MFM FETAL BPP WO NON              [id number]     Jac Romulus      STRESS                                            Madysun Thall  ----------------------------------------------------------------------   #  Order #                    Accession #                 Episode #   1  [id number]                  [id number]                  [id number]  ---------------------------------------------------------------------- Indications   [redacted] weeks gestation of pregnancy                Z3A.37   Non-reactive NST                               O28.9  ---------------------------------------------------------------------- Vital Signs  Weight (lb): 169  Height:        5'1"  BMI:         31.93 ---------------------------------------------------------------------- Fetal Evaluation  Num Of Fetuses:         1  Fetal Heart Rate(bpm):  138  Cardiac Activity:       Observed  Presentation:           Cephalic  Placenta:               Posterior Fundal  P. Cord Insertion:      Previously Visualized  Amniotic Fluid  AFI FV:      Within normal limits  AFI Sum(cm)     %Tile       Largest Pocket(cm)  13.8            52          5.98  RUQ(cm)       RLQ(cm)       LUQ(cm)        LLQ(cm)  4.35          1.83          1.64           5.98 ---------------------------------------------------------------------- Biophysical Evaluation  Amniotic F.V:   Within normal limits       F. Tone:        Observed  F. Movement:    Observed                   Score:          8/8  F. Breathing:   Observed ---------------------------------------------------------------------- OB History  Gravidity:    3         Term:   0        Prem:   0        SAB:   2  TOP:          0       Ectopic:  0        Living: 0 ---------------------------------------------------------------------- Gestational Age  LMP:           37w 4d        Date:  11/01/17                 EDD:   08/08/18  Best:          37w 4d     Det. By:  LMP  (11/01/17)          EDD:   08/08/18 ---------------------------------------------------------------------- Cervix Uterus Adnexa  Cervix  Not visualized (advanced GA >24wks) ---------------------------------------------------------------------- Impression  Amniotic fluid is normal and good fetal activity is seen.  Antenatal testing is reassuring. BPP 8/8. ----------------------------------------------------------------------                  Noralee Space, MD Electronically Signed Final Report   07/22/2018 08:37 am ----------------------------------------------------------------------  Assessment/Plan: NR NST IUP@ 37 4/7 weeks GBS cx (+) P) OBS.  Continuous fetal monitor  Danny Zimny A  Boss Danielsen 07/22/2018, 12:11 PM

## 2018-07-22 NOTE — MAU Provider Note (Signed)
History     Chief Complaint  Patient presents with  . Contractions   27 yo G3P0 BF @ 37 3/[redacted] weeks gestation presents for labor check. (+) FM intact membrane  OB History    Gravida  3   Para      Term      Preterm      AB  2   Living  0     SAB  2   TAB      Ectopic      Multiple      Live Births              Past Medical History:  Diagnosis Date  . Hyperlipidemia   . Hypertension     Past Surgical History:  Procedure Laterality Date  . NO PAST SURGERIES      No family history on file.  Social History   Tobacco Use  . Smoking status: Never Smoker  . Smokeless tobacco: Never Used  Substance Use Topics  . Alcohol use: No  . Drug use: No    Allergies:  Allergies  Allergen Reactions  . Toradol [Ketorolac Tromethamine] Hives  . Amoxicillin Hives    Has patient had a PCN reaction causing immediate rash, facial/tongue/throat swelling, SOB or lightheadedness with hypotension: Yes Has patient had a PCN reaction causing severe rash involving mucus membranes or skin necrosis: Unk Has patient had a PCN reaction that required hospitalization: Yes Has patient had a PCN reaction occurring within the last 10 years: Yes If all of the above answers are "NO", then may proceed with Cephalosporin use.   Marland Kitchen Peach [Prunus Persica] Hives    Medications Prior to Admission  Medication Sig Dispense Refill Last Dose  . Prenatal Vit-Fe Fumarate-FA (PRENATAL VITAMIN PLUS LOW IRON) 27-1 MG TABS Take 1 tablet by mouth daily. 30 tablet 2 03/02/2018 at Unknown time     Physical Exam   Blood pressure 124/87, pulse 96, temperature 98.9 F (37.2 C), resp. rate 17, height 5\' 1"  (1.549 m), weight 76.7 kg, last menstrual period 11/01/2017, SpO2 98 %.  VE per RN FT/thick/posterior  Tracing reviewed: baseline 125 min variability occ variable irreg ctx ED Course  IMP: NR NST False labor >37 weeks P) BPP MDM   Serita Kyle, MD 4:01 AM  07/22/2018 Addendum: [place] Mfm Fetal Bpp Wo Non Stress  Result Date: 07/22/2018 ----------------------------------------------------------------------  OBSTETRICS REPORT                       (Signed Final 07/22/2018 08:37 am) ---------------------------------------------------------------------- Patient Info  ID #:       [id number]                          D.O.B.:  1991/04/02 (27 yrs)  Name:       Krista Rodgers                  Visit Date: 07/22/2018 02:51 am ---------------------------------------------------------------------- Performed By  Performed By:     Tomma Lightning             Ref. Address:     [facility]                    RDMS,RVT  [facility]                                                             [facility]                                                             [address]                                                             [place], [place]                                                             [address]  Attending:        Noralee Space MD        Location:         [facility]  Referred By:      Nena Jordan                    Alphonse Asbridge MD ---------------------------------------------------------------------- Orders   #  Description                          Code         Ordered By   1  [place] MFM FETAL BPP WO NON              [phone]     Gustav Knueppel      STRESS                                            Deyana Wnuk  ----------------------------------------------------------------------   #  Order #                    Accession #                 Episode #   1  [id number]                  [id number]                  [id number]  ---------------------------------------------------------------------- Indications   [redacted] weeks gestation of pregnancy                Z3A.37   Non-reactive NST                               O28.9  ---------------------------------------------------------------------- Vital Signs  Weight (lb): 169  Height:        5'1"  BMI:         31.93 ---------------------------------------------------------------------- Fetal Evaluation  Num Of Fetuses:         1  Fetal Heart Rate(bpm):  138  Cardiac Activity:       Observed  Presentation:           Cephalic  Placenta:               Posterior Fundal  P. Cord Insertion:      Previously Visualized  Amniotic Fluid  AFI FV:      Within normal limits  AFI Sum(cm)     %Tile       Largest Pocket(cm)  13.8            52          5.98  RUQ(cm)       RLQ(cm)       LUQ(cm)        LLQ(cm)  4.35          1.83          1.64           5.98 ---------------------------------------------------------------------- Biophysical Evaluation  Amniotic F.V:   Within normal limits       F. Tone:        Observed  F. Movement:    Observed                   Score:          8/8  F. Breathing:   Observed ---------------------------------------------------------------------- OB History  Gravidity:    3         Term:   0        Prem:   0        SAB:   2  TOP:          0       Ectopic:  0        Living: 0 ---------------------------------------------------------------------- Gestational Age  LMP:           37w 4d        Date:  11/01/17                 EDD:   08/08/18  Best:          37w 4d     Det. By:  LMP  (11/01/17)          EDD:   08/08/18 ---------------------------------------------------------------------- Cervix Uterus Adnexa  Cervix  Not visualized (advanced GA >24wks) ---------------------------------------------------------------------- Impression  Amniotic fluid is normal and good fetal activity is seen.  Antenatal testing is reassuring. BPP 8/8. ----------------------------------------------------------------------                  Noralee Space, MD Electronically Signed Final Report   07/22/2018 08:37 am ----------------------------------------------------------------------  Prolonged monitoring done. No change in tracing despite oral fluid P) place in observation with continuous  monitoring

## 2018-07-22 NOTE — MAU Note (Signed)
CTX every 6-7 minutes since 1900.  No LOF/VB.  + FM.  No complications with the pregnancy.

## 2018-07-23 ENCOUNTER — Inpatient Hospital Stay (HOSPITAL_COMMUNITY): Payer: 59 | Admitting: Anesthesiology

## 2018-07-23 ENCOUNTER — Encounter (HOSPITAL_COMMUNITY)
Admission: AD | Disposition: A | Discharge status: Home / Self Care | Payer: Self-pay | Source: Home / Self Care | Attending: Obstetrics and Gynecology

## 2018-07-23 ENCOUNTER — Encounter (HOSPITAL_COMMUNITY): Payer: Self-pay | Admitting: Anesthesiology

## 2018-07-23 LAB — CBC
HCT: 28.7 % — ABNORMAL LOW (ref 36.0–46.0)
HEMATOCRIT: 26.6 % — AB (ref 36.0–46.0)
HEMOGLOBIN: 9.1 g/dL — AB (ref 12.0–15.0)
Hemoglobin: 8.7 g/dL — ABNORMAL LOW (ref 12.0–15.0)
MCH: 24.4 pg — AB (ref 26.0–34.0)
MCH: 25 pg — ABNORMAL LOW (ref 26.0–34.0)
MCHC: 31.7 g/dL (ref 30.0–36.0)
MCHC: 32.7 g/dL (ref 30.0–36.0)
MCV: 76.4 fL — ABNORMAL LOW (ref 80.0–100.0)
MCV: 76.9 fL — ABNORMAL LOW (ref 80.0–100.0)
NRBC: 0 % (ref 0.0–0.2)
PLATELETS: 202 10*3/uL (ref 150–400)
PLATELETS: 181 10*3/uL (ref 150–400)
RBC: 3.73 MIL/uL — ABNORMAL LOW (ref 3.87–5.11)
RBC: 3.48 MIL/uL — AB (ref 3.87–5.11)
RDW: 14.7 % (ref 11.5–15.5)
RDW: 14.7 % (ref 11.5–15.5)
WBC: 17.4 10*3/uL — ABNORMAL HIGH (ref 4.0–10.5)
WBC: 22.2 10*3/uL — AB (ref 4.0–10.5)
nRBC: 0 % (ref 0.0–0.2)

## 2018-07-23 SURGERY — Surgical Case
Anesthesia: Epidural

## 2018-07-23 MED ORDER — LABETALOL HCL 5 MG/ML IV SOLN
INTRAVENOUS | Status: DC | PRN
  Administered 2018-07-23: 5 mg via INTRAVENOUS
  Administered 2018-07-23: 10 mg via INTRAVENOUS
  Administered 2018-07-23: 5 mg via INTRAVENOUS

## 2018-07-23 MED ORDER — BUPIVACAINE HCL (PF) 0.25 % IJ SOLN
INTRAMUSCULAR | Status: AC
  Filled 2018-07-23: qty 30

## 2018-07-23 MED ORDER — OXYCODONE HCL 5 MG/5ML PO SOLN: 5.0000 mg | Freq: Once | ORAL | Status: DC | PRN

## 2018-07-23 MED ORDER — SCOPOLAMINE 1 MG/3DAYS TD PT72
MEDICATED_PATCH | TRANSDERMAL | Status: DC | PRN
  Administered 2018-07-23: 1 via TRANSDERMAL

## 2018-07-23 MED ORDER — FENTANYL CITRATE (PF) 100 MCG/2ML IJ SOLN
INTRAMUSCULAR | Status: AC
  Administered 2018-07-23: 50 ug via INTRAVENOUS
  Filled 2018-07-23: qty 2

## 2018-07-23 MED ORDER — MEPERIDINE HCL 25 MG/ML IJ SOLN: 6.2500 mg | INTRAMUSCULAR | Status: DC | PRN

## 2018-07-23 MED ORDER — SODIUM CHLORIDE 0.9 % IV BOLUS
INTRAVENOUS | Status: AC | PRN
  Administered 2018-07-23: 600 mL via INTRAVENOUS

## 2018-07-23 MED ORDER — OXYTOCIN 10 UNIT/ML IJ SOLN
INTRAVENOUS | Status: DC | PRN
  Administered 2018-07-23: 40 [IU] via INTRAVENOUS

## 2018-07-23 MED ORDER — LIDOCAINE-EPINEPHRINE (PF) 2 %-1:200000 IJ SOLN
INTRAMUSCULAR | Status: DC | PRN
  Administered 2018-07-23: 5 mL via EPIDURAL

## 2018-07-23 MED ORDER — ONDANSETRON HCL 4 MG/2ML IJ SOLN
INTRAMUSCULAR | Status: AC
  Filled 2018-07-23: qty 2

## 2018-07-23 MED ORDER — EPHEDRINE 5 MG/ML INJ: 10.0000 mg | INTRAVENOUS | Status: DC | PRN

## 2018-07-23 MED ORDER — LABETALOL HCL 5 MG/ML IV SOLN: 40.0000 mg | INTRAVENOUS | Status: DC | PRN

## 2018-07-23 MED ORDER — DEXAMETHASONE SODIUM PHOSPHATE 4 MG/ML IJ SOLN
INTRAMUSCULAR | Status: DC | PRN
  Administered 2018-07-23: 4 mg via INTRAVENOUS

## 2018-07-23 MED ORDER — HYDRALAZINE HCL 20 MG/ML IJ SOLN
INTRAMUSCULAR | Status: AC
  Administered 2018-07-23: 5 mg
  Filled 2018-07-23: qty 1

## 2018-07-23 MED ORDER — DIPHENHYDRAMINE HCL 50 MG/ML IJ SOLN: 12.5000 mg | INTRAMUSCULAR | Status: DC | PRN

## 2018-07-23 MED ORDER — LABETALOL HCL 5 MG/ML IV SOLN
INTRAVENOUS | Status: AC
  Filled 2018-07-23: qty 4

## 2018-07-23 MED ORDER — ONDANSETRON HCL 4 MG/2ML IJ SOLN
INTRAMUSCULAR | Status: DC | PRN
  Administered 2018-07-23: 4 mg via INTRAVENOUS

## 2018-07-23 MED ORDER — LABETALOL HCL 5 MG/ML IV SOLN
20.0000 mg | INTRAVENOUS | Status: DC | PRN
  Administered 2018-07-23: 20 mg via INTRAVENOUS

## 2018-07-23 MED ORDER — PHENYLEPHRINE 40 MCG/ML (10ML) SYRINGE FOR IV PUSH (FOR BLOOD PRESSURE SUPPORT)
80.0000 ug | PREFILLED_SYRINGE | INTRAVENOUS | Status: DC | PRN
  Filled 2018-07-23: qty 10

## 2018-07-23 MED ORDER — BUPIVACAINE HCL (PF) 0.25 % IJ SOLN
INTRAMUSCULAR | Status: DC | PRN
  Administered 2018-07-23: 10 mL

## 2018-07-23 MED ORDER — MORPHINE SULFATE (PF) 0.5 MG/ML IJ SOLN
INTRAMUSCULAR | Status: AC
  Filled 2018-07-23: qty 10

## 2018-07-23 MED ORDER — HYDRALAZINE HCL 20 MG/ML IJ SOLN: 10.0000 mg | INTRAMUSCULAR | Status: DC | PRN

## 2018-07-23 MED ORDER — LABETALOL HCL 5 MG/ML IV SOLN
20.0000 mg | INTRAVENOUS | Status: DC | PRN
  Administered 2018-07-23: 20 mg via INTRAVENOUS
  Filled 2018-07-23: qty 4

## 2018-07-23 MED ORDER — LABETALOL HCL 5 MG/ML IV SOLN
INTRAVENOUS | Status: AC
  Administered 2018-07-23: 20 mg via INTRAVENOUS
  Filled 2018-07-23: qty 4

## 2018-07-23 MED ORDER — FENTANYL 2.5 MCG/ML BUPIVACAINE 1/10 % EPIDURAL INFUSION (WH - ANES)
14.0000 mL/h | INTRAMUSCULAR | Status: DC | PRN
  Administered 2018-07-23 (×2): 14 mL/h via EPIDURAL
  Filled 2018-07-23 (×2): qty 100

## 2018-07-23 MED ORDER — SCOPOLAMINE 1 MG/3DAYS TD PT72
MEDICATED_PATCH | TRANSDERMAL | Status: AC
  Filled 2018-07-23: qty 1

## 2018-07-23 MED ORDER — LACTATED RINGERS IV SOLN
INTRAVENOUS | Status: DC | PRN
  Administered 2018-07-23: 22:00:00 via INTRAVENOUS

## 2018-07-23 MED ORDER — HYDRALAZINE HCL 20 MG/ML IJ SOLN
10.0000 mg | INTRAMUSCULAR | Status: DC | PRN
  Administered 2018-07-23: 10 mg via INTRAVENOUS

## 2018-07-23 MED ORDER — OXYCODONE HCL 5 MG PO TABS: 5.0000 mg | ORAL_TABLET | Freq: Once | ORAL | Status: DC | PRN

## 2018-07-23 MED ORDER — FENTANYL CITRATE (PF) 100 MCG/2ML IJ SOLN
25.0000 ug | INTRAMUSCULAR | Status: DC | PRN
  Administered 2018-07-23 (×2): 50 ug via INTRAVENOUS

## 2018-07-23 MED ORDER — KETOROLAC TROMETHAMINE 30 MG/ML IJ SOLN: 30.0000 mg | Freq: Four times a day (QID) | INTRAMUSCULAR | Status: DC | PRN

## 2018-07-23 MED ORDER — PHENYLEPHRINE 40 MCG/ML (10ML) SYRINGE FOR IV PUSH (FOR BLOOD PRESSURE SUPPORT): 80.0000 ug | PREFILLED_SYRINGE | INTRAVENOUS | Status: DC | PRN

## 2018-07-23 MED ORDER — LABETALOL HCL 5 MG/ML IV SOLN: 80.0000 mg | INTRAVENOUS | Status: DC | PRN

## 2018-07-23 MED ORDER — MORPHINE SULFATE (PF) 0.5 MG/ML IJ SOLN
INTRAMUSCULAR | Status: DC | PRN
  Administered 2018-07-23: 3 mg via EPIDURAL

## 2018-07-23 MED ORDER — ONDANSETRON HCL 4 MG/2ML IJ SOLN: 4.0000 mg | Freq: Once | INTRAMUSCULAR | Status: DC | PRN

## 2018-07-23 MED ORDER — LACTATED RINGERS IV SOLN: 500.0000 mL | Freq: Once | INTRAVENOUS | Status: AC

## 2018-07-23 MED ORDER — TERBUTALINE SULFATE 1 MG/ML IJ SOLN: 0.2500 mg | Freq: Once | INTRAMUSCULAR | Status: DC | PRN

## 2018-07-23 MED ORDER — ACETAMINOPHEN 325 MG PO TABS: 325.0000 mg | ORAL_TABLET | ORAL | Status: DC | PRN

## 2018-07-23 MED ORDER — HYDRALAZINE HCL 20 MG/ML IJ SOLN
5.0000 mg | INTRAMUSCULAR | Status: DC | PRN
  Administered 2018-07-23: 5 mg via INTRAVENOUS

## 2018-07-23 MED ORDER — LACTATED RINGERS IV SOLN
500.0000 mL | Freq: Once | INTRAVENOUS | Status: AC
  Administered 2018-07-23: 1000 mL via INTRAVENOUS

## 2018-07-23 MED ORDER — PHENYLEPHRINE 40 MCG/ML (10ML) SYRINGE FOR IV PUSH (FOR BLOOD PRESSURE SUPPORT)
80.0000 ug | PREFILLED_SYRINGE | INTRAVENOUS | Status: AC | PRN
  Administered 2018-07-23 (×3): 80 ug via INTRAVENOUS

## 2018-07-23 MED ORDER — ACETAMINOPHEN 160 MG/5ML PO SOLN: 325.0000 mg | ORAL | Status: DC | PRN

## 2018-07-23 MED ORDER — LIDOCAINE HCL (PF) 1 % IJ SOLN
INTRAMUSCULAR | Status: DC | PRN
  Administered 2018-07-23: 2 mL via EPIDURAL
  Administered 2018-07-23: 5 mL via EPIDURAL
  Administered 2018-07-23: 3 mL via EPIDURAL

## 2018-07-23 MED ORDER — GENTAMICIN SULFATE 40 MG/ML IJ SOLN
5.0000 mg/kg | INTRAVENOUS | Status: AC
  Administered 2018-07-23: 380 mg via INTRAVENOUS
  Filled 2018-07-23: qty 9.5

## 2018-07-23 MED ORDER — LABETALOL HCL 100 MG PO TABS
100.0000 mg | ORAL_TABLET | Freq: Two times a day (BID) | ORAL | Status: DC
  Administered 2018-07-23 – 2018-07-26 (×5): 100 mg via ORAL
  Filled 2018-07-23 (×5): qty 1

## 2018-07-23 MED ORDER — OXYTOCIN 40 UNITS IN LACTATED RINGERS INFUSION - SIMPLE MED
1.0000 m[IU]/min | INTRAVENOUS | Status: DC
  Administered 2018-07-23: 2 m[IU]/min via INTRAVENOUS
  Filled 2018-07-23: qty 1000

## 2018-07-23 MED ORDER — DEXAMETHASONE SODIUM PHOSPHATE 10 MG/ML IJ SOLN
INTRAMUSCULAR | Status: AC
  Filled 2018-07-23: qty 1

## 2018-07-23 MED ORDER — OXYTOCIN 10 UNIT/ML IJ SOLN
INTRAMUSCULAR | Status: AC
  Filled 2018-07-23: qty 4

## 2018-07-23 MED ORDER — CLINDAMYCIN PHOSPHATE 900 MG/50ML IV SOLN: 900.0000 mg | Freq: Three times a day (TID) | INTRAVENOUS | Status: DC

## 2018-07-23 MED ORDER — SODIUM CHLORIDE 0.9 % IV BOLUS
INTRAVENOUS | Status: AC | PRN
  Administered 2018-07-23: 1000 mL via INTRAVENOUS

## 2018-07-23 SURGICAL SUPPLY — 44 items
BARRIER ADHS 3X4 INTERCEED (GAUZE/BANDAGES/DRESSINGS) ×2 IMPLANT
BENZOIN TINCTURE PRP APPL 2/3 (GAUZE/BANDAGES/DRESSINGS) ×2 IMPLANT
CHLORAPREP W/TINT 26ML (MISCELLANEOUS) ×2 IMPLANT
CLAMP CORD UMBIL (MISCELLANEOUS) IMPLANT
CLOTH BEACON ORANGE TIMEOUT ST (SAFETY) ×2 IMPLANT
DRAPE C SECTION CLR SCREEN (DRAPES) ×2 IMPLANT
DRSG OPSITE POSTOP 4X10 (GAUZE/BANDAGES/DRESSINGS) ×2 IMPLANT
ELECT REM PT RETURN 9FT ADLT (ELECTROSURGICAL) ×2
ELECTRODE REM PT RTRN 9FT ADLT (ELECTROSURGICAL) ×1 IMPLANT
EXTRACTOR VACUUM M CUP 4 TUBE (SUCTIONS) IMPLANT
GLOVE BIOGEL PI IND STRL 7.0 (GLOVE) ×2 IMPLANT
GLOVE BIOGEL PI INDICATOR 7.0 (GLOVE) ×2
GLOVE ECLIPSE 6.5 STRL STRAW (GLOVE) ×2 IMPLANT
GOWN STRL REUS W/TWL LRG LVL3 (GOWN DISPOSABLE) ×4 IMPLANT
KIT ABG SYR 3ML LUER SLIP (SYRINGE) IMPLANT
NEEDLE HYPO 22GX1.5 SAFETY (NEEDLE) ×2 IMPLANT
NEEDLE HYPO 25X5/8 SAFETYGLIDE (NEEDLE) IMPLANT
NS IRRIG 1000ML POUR BTL (IV SOLUTION) ×2 IMPLANT
PACK C SECTION WH (CUSTOM PROCEDURE TRAY) ×2 IMPLANT
PAD ABD 7.5X8 STRL (GAUZE/BANDAGES/DRESSINGS) ×2 IMPLANT
PAD OB MATERNITY 4.3X12.25 (PERSONAL CARE ITEMS) ×2 IMPLANT
RTRCTR C-SECT PINK 25CM LRG (MISCELLANEOUS) IMPLANT
SPONGE GAUZE 4X4 12PLY STER LF (GAUZE/BANDAGES/DRESSINGS) ×4 IMPLANT
SPONGE LAP 18X18 RF (DISPOSABLE) ×6 IMPLANT
STRIP CLOSURE SKIN 1/2X4 (GAUZE/BANDAGES/DRESSINGS) ×2 IMPLANT
SUT CHROMIC GUT AB #0 18 (SUTURE) IMPLANT
SUT MNCRL 0 VIOLET CTX 36 (SUTURE) ×3 IMPLANT
SUT MON AB 2-0 SH 27 (SUTURE)
SUT MON AB 2-0 SH27 (SUTURE) IMPLANT
SUT MON AB 3-0 SH 27 (SUTURE)
SUT MON AB 3-0 SH27 (SUTURE) IMPLANT
SUT MON AB 4-0 PS1 27 (SUTURE) IMPLANT
SUT MONOCRYL 0 CTX 36 (SUTURE) ×3
SUT PLAIN 2 0 (SUTURE)
SUT PLAIN 2 0 XLH (SUTURE) IMPLANT
SUT PLAIN ABS 2-0 CT1 27XMFL (SUTURE) IMPLANT
SUT VIC AB 0 CT1 36 (SUTURE) ×4 IMPLANT
SUT VIC AB 2-0 CT1 27 (SUTURE) ×1
SUT VIC AB 2-0 CT1 TAPERPNT 27 (SUTURE) ×1 IMPLANT
SUT VIC AB 4-0 PS2 27 (SUTURE) IMPLANT
SYR CONTROL 10ML LL (SYRINGE) ×2 IMPLANT
TAPE CLOTH SURG 4X10 WHT LF (GAUZE/BANDAGES/DRESSINGS) ×2 IMPLANT
TOWEL OR 17X24 6PK STRL BLUE (TOWEL DISPOSABLE) ×2 IMPLANT
TRAY FOLEY W/BAG SLVR 14FR LF (SET/KITS/TRAYS/PACK) IMPLANT

## 2018-07-23 NOTE — Progress Notes (Signed)
Pharmacy Antibiotic Note  Krista Rodgers is a 27 y.o. female admitted on 07/22/2018 with gestational Hypertension at [redacted]w[redacted]d .  Pharmacy has been consulted for Gentamicin dosing for chorioamnionitis with dosing needed prior to Csection.  Plan: Gentamicin 5mg /kg (380mg ) x 1 prior to Csection.  This should provide 24 hours of coverage so no further doses of Gentamicin should be needed unless > 24 hours treatment desired. Clindamycin to be continued post-op  Height: 5\' 1"  (154.9 cm) Weight: 169 lb 1 oz (76.7 kg) IBW/kg (Calculated) : 47.8  Temp (24hrs), Avg:99.7 F (37.6 C), Min:98.2 F (36.8 C), Max:101.2 F (38.4 C)  Recent Labs  Lab 07/22/18 0413 07/22/18 1228 07/23/18 0925  WBC 14.5* 13.0* 17.4*  CREATININE  --  0.74  --     Estimated Creatinine Clearance: 99.1 mL/min (by C-G formula based on SCr of 0.74 mg/dL).    Allergies  Allergen Reactions  . Toradol [Ketorolac Tromethamine] Hives  . Amoxicillin Hives    Has patient had a PCN reaction causing immediate rash, facial/tongue/throat swelling, SOB or lightheadedness with hypotension: Yes Has patient had a PCN reaction causing severe rash involving mucus membranes or skin necrosis: Unk Has patient had a PCN reaction that required hospitalization: Yes Has patient had a PCN reaction occurring within the last 10 years: Yes If all of the above answers are "NO", then may proceed with Cephalosporin use.   Marland Kitchen Peach [Prunus Persica] Hives    Antimicrobials this admission: Vancomycin 1 gram IV q12h (GBS prophylaxis) 10/17 >>    Thank you for allowing pharmacy to be a part of this patient's care.  Claybon Jabs 07/23/2018 9:11 PM

## 2018-07-23 NOTE — Anesthesia Preprocedure Evaluation (Signed)
Anesthesia Evaluation  Patient identified by MRN, date of birth, ID band Patient awake    Reviewed: Allergy & Precautions, Patient's Chart, lab work & pertinent test results  Airway Mallampati: II  TM Distance: >3 FB     Dental   Pulmonary neg pulmonary ROS,    Pulmonary exam normal        Cardiovascular hypertension, Normal cardiovascular exam     Neuro/Psych negative neurological ROS     GI/Hepatic negative GI ROS, Neg liver ROS,   Endo/Other  negative endocrine ROS  Renal/GU negative Renal ROS     Musculoskeletal   Abdominal   Peds  Hematology  (+) anemia ,   Anesthesia Other Findings   Reproductive/Obstetrics (+) Pregnancy                             Lab Results  Component Value Date   WBC 17.4 (H) 07/23/2018   HGB 9.1 (L) 07/23/2018   HCT 28.7 (L) 07/23/2018   MCV 76.9 (L) 07/23/2018   PLT 202 07/23/2018    Anesthesia Physical Anesthesia Plan  ASA: III  Anesthesia Plan: Epidural   Post-op Pain Management:    Induction:   PONV Risk Score and Plan: Treatment may vary due to age or medical condition  Airway Management Planned: Natural Airway  Additional Equipment:   Intra-op Plan:   Post-operative Plan:   Informed Consent: I have reviewed the patients History and Physical, chart, labs and discussed the procedure including the risks, benefits and alternatives for the proposed anesthesia with the patient or authorized representative who has indicated his/her understanding and acceptance.     Plan Discussed with:   Anesthesia Plan Comments:         Anesthesia Quick Evaluation

## 2018-07-23 NOTE — Progress Notes (Signed)
S/p cytotec # 3 Notes cramping  O:BP 137/85   Pulse 97   Temp 98.8 F (37.1 C) (Oral)   Resp 18   Ht 5\' 1"  (1.549 m)   Wt 76.7 kg   LMP 11/01/2017 (Exact Date)   SpO2 100%   BMI 31.94 kg/m  VE intracervical balloon still in place  Tracing: baseline 130 small accels irreg ctx   IMP: gest HTN GBS cx (+) P) may use nitrous oxide for pain mgmt. IV pitocin when next med . Await foley out

## 2018-07-23 NOTE — Brief Op Note (Signed)
07/23/2018  10:35 PM  PATIENT:  Vivien Presto  27 y.o. female  PRE-OPERATIVE DIAGNOSIS:  Arrest of dilation, presumed chorioamnionitis, gestational hypertension, IUP@37  5/7 weeks  POST-OPERATIVE DIAGNOSIS:  Arrest of dilation, presumed chorioamnionitis. Gestational hypertension, IUP@ 37 5/7 wks  PROCEDURE:  Primary Cesarean section, kerr hysterotomy  SURGEON:  Surgeon(s) and Role:    * Maxie Better, MD - Primary  PHYSICIAN ASSISTANT:   ASSISTANTS: Arlan Organ, CNM   ANESTHESIA:   epidural Findings: live female left compound( left hand) LOP with large caput, nl tubes and ovaries, post placenta, nl appendix EBL:  133 mL   BLOOD ADMINISTERED:none  DRAINS: none   LOCAL MEDICATIONS USED:  MARCAINE     SPECIMEN:  Source of Specimen:  placenta  DISPOSITION OF SPECIMEN:  PATHOLOGY  COUNTS:  YES  TOURNIQUET:  * No tourniquets in log *  DICTATION: .Other Dictation: Dictation Number [id number]  PLAN OF CARE: Admit to inpatient   PATIENT DISPOSITION:  PACU - hemodynamically stable.   Delay start of Pharmacological VTE agent (>24hrs) due to surgical blood loss or risk of bleeding: no

## 2018-07-23 NOTE — Progress Notes (Signed)
S; epidural  O: pitocin. BP 114/77   Pulse 94   Temp 98.4 F (36.9 C) (Axillary)   Resp 20   Ht 5\' 1"  (1.549 m)   Wt 76.7 kg   LMP 11/01/2017 (Exact Date)   SpO2 97%   BMI 31.94 kg/m  VE 4/60/-3 AROM clear copious fluid ISE placed.  Tracing: baseline 130  Small accel  some variables previously Ctx q 2-5 mins    IMP: Latent phase GBS cx (+)  Gest HTN  P) right exaggerated sims. Cont pitocin.

## 2018-07-23 NOTE — Anesthesia Procedure Notes (Signed)
Epidural Patient location during procedure: OB Start time: 07/23/2018 10:41 AM End time: 07/23/2018 10:51 AM  Staffing Anesthesiologist: Marcene Duos, MD Performed: anesthesiologist   Preanesthetic Checklist Completed: patient identified, site marked, surgical consent, pre-op evaluation, timeout performed, IV checked, risks and benefits discussed and monitors and equipment checked  Epidural Patient position: sitting Prep: site prepped and draped and DuraPrep Patient monitoring: continuous pulse ox and blood pressure Approach: midline Location: L3-L4 Injection technique: LOR air  Needle:  Needle type: Tuohy  Needle gauge: 17 G Needle length: 9 cm and 9 Needle insertion depth: 8 cm Catheter type: closed end flexible Catheter size: 19 Gauge Catheter at skin depth: 15 (14-->15cm when laid in lat decub) cm Test dose: negative  Assessment Events: blood not aspirated, injection not painful, no injection resistance, negative IV test and paresthesia (Resolved with withdrawing of needle and performing at a different level)

## 2018-07-23 NOTE — Transfer of Care (Signed)
Immediate Anesthesia Transfer of Care Note  Patient: Krista Rodgers  Procedure(s) Performed: CESAREAN SECTION (N/A )  Patient Location: PACU  Anesthesia Type:Epidural  Level of Consciousness: awake, alert  and oriented  Airway & Oxygen Therapy: Patient Spontanous Breathing  Post-op Assessment: Report given to RN and Post -op Vital signs reviewed and stable  Post vital signs: Reviewed and stable  Last Vitals:  Vitals Value Taken Time  BP    Temp 36.8 C 07/23/2018 10:36 PM  Pulse 107 07/23/2018 10:36 PM  Resp 15 07/23/2018 10:36 PM  SpO2 98 % 07/23/2018 10:36 PM  Vitals shown include unvalidated device data.  Last Pain:  Vitals:   07/23/18 2006  TempSrc: Oral  PainSc: 5       Patients Stated Pain Goal: 4 (07/23/18 0730)  Complications: No apparent anesthesia complications

## 2018-07-23 NOTE — Anesthesia Pain Management Evaluation Note (Signed)
  CRNA Pain Management Visit Note  Patient: Krista Rodgers, 27 y.o., female  "Hello I am a member of the anesthesia team at [facility]. We have an anesthesia team available at all times to provide care throughout the hospital, including epidural management and anesthesia for C-section. I don't know your plan for the delivery whether it a natural birth, water birth, IV sedation, nitrous supplementation, doula or epidural, but we want to meet your pain goals."   1.Was your pain managed to your expectations on prior hospitalizations?   No prior hospitalizations  2.What is your expectation for pain management during this hospitalization?     Epidural  3.How can we help you reach that goal? Support prn  Record the patient's initial score and the patient's pain goal.   Pain: 5  Pain Goal: 2 [facility] wants you to be able to say your pain was always managed very well.  [facility] 07/23/2018

## 2018-07-23 NOTE — Progress Notes (Signed)
S. Notes ctx  O: BP (!) 140/99 (BP Location: Left Arm)   Pulse 95   Temp 98.4 F (36.9 C) (Axillary)   Resp 20   Ht 5\' 1"  (1.549 m)   Wt 76.7 kg   LMP 11/01/2017 (Exact Date)   SpO2 100%   BMI 31.94 kg/m  Pitocin VE balloon removed 4/60-70%/-3 applied (+) bloody show  Tracing: baseline 140  (+)10x 10 accel. Some variable Some couplets. Ctx q  1 1/2 - 4 mins  IMP: latent phase GBS cx (+) on vancomycin Gest HTN on protocol P) epidural. Cont increase pitocin. Defer amniotomy. Exaggerated sims positions. Anti-HTN med per protocol. Will go ahead and start oral med

## 2018-07-23 NOTE — Progress Notes (Signed)
S; some pelvic discomfort  O; temp 101.2 ax  Pulse 114  BP 104/54 VE: 6/90/0 with large caput deviated to left  Tracing: baseline 150 no accel Ctx quads, couplets  IMP: arrest of dilation Presumed chorioamnionitis GBS cx (+) on IV Vancomycin Gest HTN on labetalol P) proceed with primary C/S. Start Gent/clindamycin. Risk of surgery reviewed including infection, bleeding, injury to bladder, bowels, ureter, internal scar tissue, poss need for C/s in the future All ? Answered. Consent signed

## 2018-07-24 ENCOUNTER — Encounter (HOSPITAL_COMMUNITY): Payer: Self-pay | Admitting: Obstetrics and Gynecology

## 2018-07-24 DIAGNOSIS — O411290 Chorioamnionitis, unspecified trimester, not applicable or unspecified: Secondary | ICD-10-CM | POA: Diagnosis not present

## 2018-07-24 LAB — CBC
HCT: 26.8 % — ABNORMAL LOW (ref 36.0–46.0)
HEMOGLOBIN: 8.6 g/dL — AB (ref 12.0–15.0)
MCH: 24.3 pg — ABNORMAL LOW (ref 26.0–34.0)
MCHC: 32.1 g/dL (ref 30.0–36.0)
MCV: 75.7 fL — ABNORMAL LOW (ref 80.0–100.0)
PLATELETS: 197 10*3/uL (ref 150–400)
RBC: 3.54 MIL/uL — ABNORMAL LOW (ref 3.87–5.11)
RDW: 14.7 % (ref 11.5–15.5)
WBC: 26.9 10*3/uL — ABNORMAL HIGH (ref 4.0–10.5)
nRBC: 0 % (ref 0.0–0.2)

## 2018-07-24 MED ORDER — SIMETHICONE 80 MG PO CHEW
80.0000 mg | CHEWABLE_TABLET | Freq: Three times a day (TID) | ORAL | Status: DC
  Administered 2018-07-24 – 2018-07-28 (×12): 80 mg via ORAL
  Filled 2018-07-24 (×12): qty 1

## 2018-07-24 MED ORDER — SIMETHICONE 80 MG PO CHEW
80.0000 mg | CHEWABLE_TABLET | ORAL | Status: DC
  Administered 2018-07-24 – 2018-07-28 (×5): 80 mg via ORAL
  Filled 2018-07-24 (×5): qty 1

## 2018-07-24 MED ORDER — LACTATED RINGERS IV SOLN
INTRAVENOUS | Status: DC
  Administered 2018-07-24 (×3): via INTRAVENOUS

## 2018-07-24 MED ORDER — OXYCODONE-ACETAMINOPHEN 5-325 MG PO TABS
1.0000 | ORAL_TABLET | ORAL | Status: DC | PRN
  Administered 2018-07-24 – 2018-07-28 (×10): 1 via ORAL
  Filled 2018-07-24 (×10): qty 1

## 2018-07-24 MED ORDER — ZOLPIDEM TARTRATE 5 MG PO TABS: 5.0000 mg | ORAL_TABLET | Freq: Every evening | ORAL | Status: DC | PRN

## 2018-07-24 MED ORDER — MENTHOL 3 MG MT LOZG: 1.0000 | LOZENGE | OROMUCOSAL | Status: DC | PRN

## 2018-07-24 MED ORDER — SIMETHICONE 80 MG PO CHEW: 80.0000 mg | CHEWABLE_TABLET | ORAL | Status: DC | PRN

## 2018-07-24 MED ORDER — CLINDAMYCIN PHOSPHATE 900 MG/50ML IV SOLN
900.0000 mg | Freq: Three times a day (TID) | INTRAVENOUS | Status: DC
  Administered 2018-07-24 – 2018-07-25 (×4): 900 mg via INTRAVENOUS
  Filled 2018-07-24 (×6): qty 50

## 2018-07-24 MED ORDER — SENNOSIDES-DOCUSATE SODIUM 8.6-50 MG PO TABS
2.0000 | ORAL_TABLET | ORAL | Status: DC
  Administered 2018-07-24 – 2018-07-28 (×5): 2 via ORAL
  Filled 2018-07-24 (×5): qty 2

## 2018-07-24 MED ORDER — WITCH HAZEL-GLYCERIN EX PADS: 1.0000 "application " | MEDICATED_PAD | CUTANEOUS | Status: DC | PRN

## 2018-07-24 MED ORDER — DIPHENHYDRAMINE HCL 25 MG PO CAPS: 25.0000 mg | ORAL_CAPSULE | Freq: Four times a day (QID) | ORAL | Status: DC | PRN

## 2018-07-24 MED ORDER — OXYTOCIN 40 UNITS IN LACTATED RINGERS INFUSION - SIMPLE MED: 2.5000 [IU]/h | INTRAVENOUS | Status: AC

## 2018-07-24 MED ORDER — OXYCODONE-ACETAMINOPHEN 5-325 MG PO TABS
2.0000 | ORAL_TABLET | ORAL | Status: DC | PRN
  Administered 2018-07-25 – 2018-07-27 (×5): 2 via ORAL
  Filled 2018-07-24 (×5): qty 2

## 2018-07-24 MED ORDER — COCONUT OIL OIL: 1.0000 "application " | TOPICAL_OIL | Status: DC | PRN

## 2018-07-24 MED ORDER — PRENATAL MULTIVITAMIN CH
1.0000 | ORAL_TABLET | Freq: Every day | ORAL | Status: DC
  Administered 2018-07-24 – 2018-07-27 (×4): 1 via ORAL
  Filled 2018-07-24 (×4): qty 1

## 2018-07-24 MED ORDER — DIBUCAINE 1 % RE OINT: 1.0000 "application " | TOPICAL_OINTMENT | RECTAL | Status: DC | PRN

## 2018-07-24 NOTE — Op Note (Signed)
Krista Rodgers, Krista Rodgers MEDICAL RECORD [id number] ACCOUNT [id number] DATE OF BIRTH:1991-08-17 FACILITY: [facility] LOCATION: [id number] PHYSICIAN:Mozelle Remlinger A. Kriston Pasquarello, MD  OPERATIVE REPORT  DATE OF PROCEDURE:  07/23/2018  PREOPERATIVE DIAGNOSES:   1.  Arrest of dilatation, presumed chorioamnionitis.   2.  Gestational hypertension. 3.   Intrauterine gestation at 59 and 5/7 weeks.  POSTOPERATIVE DIAGNOSES:   1.  Arrest of dilatation,  2. Presumed chorioamnionitis.   2.  Gestational hypertension. 3.   Intrauterine gestation at 47 and 5/7 weeks.  PROCEDURE:  Primary cesarean section, Kerr hysterotomy.  ANESTHESIA:  Epidural.  SURGEON:  Maxie Better, MD  ASSISTANTS:  Arlan Organ, CNM.  DESCRIPTION OF PROCEDURE:  Under adequate epidural anesthesia, the patient was placed in the supine position with a left lateral tilt.  An indwelling Foley catheter was already in place.  The patient was quickly prepped and draped in the usual fashion.  Marcaine 0.25% was injected along the planned Pfannenstiel skin incision site.  Pfannenstiel skin incision was made, carried down to the rectus fascia.  Rectus fascia opened transversely.  The rectus fascia was then bluntly and sharply dissected off the  rectus muscle in a superior and inferior fashion.  The rectus muscle split in the midline.  The parietal peritoneum was entered bluntly and extended.  An Alexis self-retaining retractor was then placed.  A well-developed lower uterine segment was noted.  A curvilinear low transverse uterine incision was made after the vesicouterine peritoneum had been opened.  The incision was extended with bandage scissors.  Subsequent delivery of a live female from the left occiput posterior presentation with a left hand compound presentation was accomplished.  The baby was bulb suctioned on the abdomen.  Delayed cord clamp for a minute was done.  Cord was then clamped, cut.  The baby was transferred to the waiting  pediatrician who assigned Apgars of 9 and 10 at 1 and 5 minutes respectively.  The placenta was spontaneous intact, sent to pathology due to maternal fever.  Uterine cavity was cleaned of debris.  Uterine incision had no extension was closed in 2 layers, the first layer was 0 Monocryl running lock stitch, second layer was imbricated using 0 Monocryl suture.  Normal tubes and ovaries were noted bilaterally.  A normal appendix was noted.  The abdomen was irrigated and suctioned of debris.  Interceed was placed in the lower uterine segment in  inverted T fashion.  The Alexis retractor was then removed.  The parietal peritoneum was closed with 2-0 Vicryl.  The rectus fascia was closed with 0 Vicryl x2.  The subcutaneous area was irrigated, small bleeders cauterized.  Interrupted 2-0 plain sutures placed and the skin approximated using 4-0 Vicryl subcuticular closure.  Steri-Strips and benzoin were placed.  SPECIMEN:  Placenta sent to pathology.  ESTIMATED BLOOD LOSS:  400 mL.  INTRAOPERATIVE FLUIDS:  2400 mL.  URINE OUTPUT:  200 mL.  COUNTS:  Sponge and instrument count x2 was correct.  COMPLICATIONS:  None.  The patient transferred to the recovery room in stable condition.  AN/NUANCE  D:07/23/2018 T:07/24/2018 JOB:[id number]

## 2018-07-24 NOTE — Lactation Note (Signed)
This note was copied from a baby's chart. Lactation Consultation Note  Patient Name: Krista Rodgers [id number] Date: 07/24/2018 Reason for consult: Initial assessment;Primapara;1st time breastfeeding;Early term 39-38.6wks  Visited with P1 Mom and FOB of ET infant at 48 hrs old.  Baby has been sleepy, and latched before falling asleep after a couple minutes.  Mom taught how to massage breasts and hand express for drops of colostrum. Baby bathed and was STS for an hour.  Baby swaddled in crib, quiet and alert.  Offered to place baby STS and assist with a latch.  Mom exhausted and asked to take a nap while baby is in crib.   Mom to call at next feeding. Lactation brochure left in room.  Mom aware of IP and OP lactation support available.    Consult Status Consult Status: Follow-up Date: 07/25/18 Follow-up type: In-patient    [name] 07/24/2018, 1:10 PM

## 2018-07-24 NOTE — Addendum Note (Signed)
Addendum  created 07/24/18 0754 by Angela Adam, CRNA   Sign clinical note

## 2018-07-24 NOTE — Anesthesia Postprocedure Evaluation (Signed)
Anesthesia Post Note  Patient: Krista Rodgers  Procedure(s) Performed: CESAREAN SECTION (N/A )     Patient location during evaluation: Mother Baby Anesthesia Type: Epidural Level of consciousness: awake and alert Pain management: pain level controlled Vital Signs Assessment: post-procedure vital signs reviewed and stable Respiratory status: spontaneous breathing, nonlabored ventilation and respiratory function stable Cardiovascular status: stable Postop Assessment: no headache, no backache and epidural receding Anesthetic complications: no    Last Vitals:  Vitals:   07/24/18 0230 07/24/18 0336  BP: (!) 136/94 128/87  Pulse: 79 84  Resp: 18 18  Temp: 37.1 C 37 C  SpO2: 96% 95%    Last Pain:  Vitals:   07/24/18 0336  TempSrc: Oral  PainSc: 0-No pain   Pain Goal: Patients Stated Pain Goal: 4 (07/23/18 2236)               Jasin Brazel

## 2018-07-24 NOTE — Progress Notes (Signed)
Subjective: POD# 1 Live born female  Birth Weight: 6 lb 4.9 oz (2860 g) APGAR: 9, 10  Newborn Delivery   Birth date/time:  07/23/2018 21:49:00 Delivery type:  C-Section, Low Transverse Trial of labor:  Yes C-section categorization:  Primary     Delivering provider: COUSINS, SHERONETTE   circumcision planned Baby name: Zayn Feeding: breast  Pain control at delivery: Epidural   Reports feeling well. Feeding: breast Patient reports tolerating PO.  Breast symptoms: none Pain controlled with PO meds Denies HA/SOB/C/P/N/V/dizziness. Flatus absent. She reports vaginal bleeding as normal, without clots.  She is ambulating, no voids yes, foley in place.   Objective:   VS:    Vitals:   07/24/18 0336 07/24/18 0603 07/24/18 0715 07/24/18 0921  BP: 128/87 126/88 (!) 132/93 121/83  Pulse: 84 69 80 73  Resp: 18  16   Temp: 98.6 F (37 C)  98.3 F (36.8 C)   TempSrc: Oral  Oral   SpO2: 95%  97%   Weight:      Height:          Intake/Output Summary (Last 24 hours) at 07/24/2018 0934 Last data filed at 07/24/2018 0230 Gross per 24 hour  Intake 1400 ml  Output 2783 ml  Net -1383 ml        Recent Labs    07/23/18 2333 07/24/18 0514  WBC 22.2* 26.9*  HGB 8.7* 8.6*  HCT 26.6* 26.8*  PLT 181 197     Blood type: --/--/A POS, A POS Performed at [facility], [address] [place], [place] [address]  [phone])  Rubella: Immune (04/15 0000)     Physical Exam:  General: alert, cooperative and no distress CV: Regular rate and rhythm Resp: clear Abdomen: soft, nontender, normal bowel sounds Incision: clean, dry and intact Uterine Fundus: firm, below umbilicus, nontender Lochia: minimal Ext: no edema, redness or tenderness in the calves or thighs      Assessment/Plan: 27 y.o.   POD# 1. [id number]                  Principal Problem:   1C/S - FTD 10/8 Active Problems:   Non-reactive NST (non-stress test)   Gestational hypertension w/o significant  proteinuria in 3rd trimester   Postpartum care following cesarean delivery   Chorioamnionitis  - continue IV ABX dual therapy x 24 hrs PP, to be DCed if remains afebrile at 24 hrs  Doing well, stable.  Good diuresis and BP normotensive PP              Advance diet as tolerated Encourage rest when baby rests Breastfeeding support Encourage to ambulate, DC foley this AM. Routine post-op care  Neta Mends, CNM, MSN 07/24/2018, 9:34 AM

## 2018-07-24 NOTE — Anesthesia Postprocedure Evaluation (Signed)
Anesthesia Post Note  Patient: Krista Rodgers  Procedure(s) Performed: CESAREAN SECTION (N/A )     Patient location during evaluation: Mother Baby Anesthesia Type: Epidural Level of consciousness: awake and alert, oriented and patient cooperative Pain management: pain level controlled Vital Signs Assessment: post-procedure vital signs reviewed and stable Respiratory status: spontaneous breathing Cardiovascular status: stable Postop Assessment: no headache, epidural receding, patient able to bend at knees and no signs of nausea or vomiting Anesthetic complications: no Comments: Pain score 0.     Last Vitals:  Vitals:   07/24/18 0336 07/24/18 0603  BP: 128/87 126/88  Pulse: 84 69  Resp: 18   Temp: 37 C   SpO2: 95%     Last Pain:  Vitals:   07/24/18 0336  TempSrc: Oral  PainSc: 0-No pain   Pain Goal: Patients Stated Pain Goal: 4 (07/23/18 2236)               Merrilyn Puma

## 2018-07-25 NOTE — Lactation Note (Signed)
This note was copied from a baby's chart. Lactation Consultation Note  Patient Name: Krista Rodgers [id number] Date: 07/25/2018 Reason for consult: Follow-up assessment  Infant had recently been given his 1st bottle. Mom was worried that breastfeeding was not for her. I explained that sometimes ETIs don't do well at the breast. Mom reports + significant breast changes w/pregnancy. Hand expression was taught to Mom & drops of colostrum were easily expressed. Mom was pleased.  Mom informed that, ideally, she should express her milk whenever infant receives formula. However, tonight I suggested that she allow Dad to feed infant with bottle so that she can get some sleep. Dad said he would be glad to do so.   No stool has been passed today, but parents were reassured by [name], RN that since we were now supplementing we can expect a stool to be forthcoming.   Lurline Hare [facility] 07/25/2018, 10:25 PM

## 2018-07-25 NOTE — Progress Notes (Signed)
Subjective: POD# 2 Live born female Krista Rodgers Birth Weight: 6 lb 4.9 oz (2860 g) APGAR: 9, 10  Newborn Delivery   Birth date/time:  07/23/2018 21:49:00 Delivery type:  C-Section, Low Transverse Trial of labor:  Yes C-section categorization:  Primary     Delivering provider: COUSINS, SHERONETTE   circumcision planned  Feeding: breast  Pain control at delivery: Epidural   Reports feeling sore at incision today. Feeding: breast Patient reports tolerating PO.  Breast symptoms: none Pain moderately controlled with Percocet, no NSAIDs d/t allergy. Denies HA/SOB/C/P/N/V/dizziness. Flatus absent, reports burping only. She reports vaginal bleeding as normal, without clots.  She is ambulating, urinating without difficulty.     Objective:   VS:    Vitals:   07/24/18 1130 07/24/18 1530 07/24/18 1940 07/25/18 0610  BP: (!) 104/57 104/67 102/65 112/74  Pulse: 66 81 90 91  Resp: 18 18 18 17   Temp: 98.5 F (36.9 C) 97.7 F (36.5 C) 98.3 F (36.8 C) 98.5 F (36.9 C)  TempSrc: Oral Oral Oral Oral  SpO2: 99% 100% 100% 99%  Weight:      Height:          Intake/Output Summary (Last 24 hours) at 07/25/2018 0901 Last data filed at 07/24/2018 2150 Gross per 24 hour  Intake 840 ml  Output 1050 ml  Net -210 ml        Recent Labs    07/23/18 2333 07/24/18 0514  WBC 22.2* 26.9*  HGB 8.7* 8.6*  HCT 26.6* 26.8*  PLT 181 197     Blood type: --/--/A POS, A POS Performed at [facility], [address], [place], [place] [address]  [phone])  Rubella: Immune (04/15 0000)     Physical Exam:  General: alert, cooperative and no distress Abdomen: + BS, mild distention Incision: clean, dry and intact Uterine Fundus: firm, below umbilicus, nontender Lochia: minimal Ext: no edema, redness or tenderness in the calves or thighs      Assessment/Plan: 27 y.o.   POD# 2. [id number]                  Principal Problem:   1C/S - FTD 10/8 Active Problems:   Non-reactive NST  (non-stress test)   Gestational hypertension w/o significant proteinuria in 3rd trimester - BP low normal range, will DC labetalol for now and monitor BP closely.    Postpartum care following cesarean delivery   Chorioamnionitis  - afebrile x 24 hrs post-op, ABX DCed EPDS score 11, increased risl of PPD - denies hx of anxiety, increased anxiety about baby and care of.   Doing well, stable.    Encourage to ambulate frequently today, warm fluids advised Consider 2 wks PP visit for PPD/anxiety Social work consult pending Routine post-op care  Neta Mends, CNM, MSN 07/25/2018, 9:01 AM

## 2018-07-26 DIAGNOSIS — D62 Acute posthemorrhagic anemia: Secondary | ICD-10-CM | POA: Diagnosis not present

## 2018-07-26 LAB — COMPREHENSIVE METABOLIC PANEL
ALT: 13 U/L (ref 0–44)
AST: 25 U/L (ref 15–41)
Albumin: 2.1 g/dL — ABNORMAL LOW (ref 3.5–5.0)
Alkaline Phosphatase: 70 U/L (ref 38–126)
Anion gap: 8 (ref 5–15)
BUN: 7 mg/dL (ref 6–20)
CO2: 23 mmol/L (ref 22–32)
Calcium: 8 mg/dL — ABNORMAL LOW (ref 8.9–10.3)
Chloride: 109 mmol/L (ref 98–111)
Creatinine, Ser: 0.88 mg/dL (ref 0.44–1.00)
GFR calc Af Amer: >60 mL/min (ref >60)
GFR calc non Af Amer: >60 mL/min (ref >60)
Glucose, Bld: 108 mg/dL — ABNORMAL HIGH (ref 70–99)
Potassium: 3.2 mmol/L — ABNORMAL LOW (ref 3.5–5.1)
Sodium: 140 mmol/L (ref 135–145)
Total Bilirubin: 0.3 mg/dL (ref 0.3–1.2)
Total Protein: 5.4 g/dL — ABNORMAL LOW (ref 6.5–8.1)

## 2018-07-26 LAB — CBC
HCT: 22.8 % — ABNORMAL LOW (ref 36.0–46.0)
Hemoglobin: 7.4 g/dL — ABNORMAL LOW (ref 12.0–15.0)
MCH: 24.6 pg — ABNORMAL LOW (ref 26.0–34.0)
MCHC: 32.5 g/dL (ref 30.0–36.0)
MCV: 75.7 fL — ABNORMAL LOW (ref 80.0–100.0)
Platelets: 190 10*3/uL (ref 150–400)
RBC: 3.01 MIL/uL — ABNORMAL LOW (ref 3.87–5.11)
RDW: 15.3 % (ref 11.5–15.5)
WBC: 11.7 10*3/uL — ABNORMAL HIGH (ref 4.0–10.5)
nRBC: 0 % (ref 0.0–0.2)

## 2018-07-26 MED ORDER — MAGNESIUM OXIDE 400 (241.3 MG) MG PO TABS
400.0000 mg | ORAL_TABLET | Freq: Every day | ORAL | Status: DC
  Administered 2018-07-26 – 2018-07-28 (×3): 400 mg via ORAL
  Filled 2018-07-26 (×3): qty 1

## 2018-07-26 MED ORDER — POLYSACCHARIDE IRON COMPLEX 150 MG PO CAPS
150.0000 mg | ORAL_CAPSULE | Freq: Every day | ORAL | Status: DC
  Administered 2018-07-26 – 2018-07-28 (×3): 150 mg via ORAL
  Filled 2018-07-26 (×3): qty 1

## 2018-07-26 MED ORDER — HYDRALAZINE HCL 50 MG PO TABS
50.0000 mg | ORAL_TABLET | Freq: Three times a day (TID) | ORAL | Status: DC
  Administered 2018-07-26 – 2018-07-28 (×6): 50 mg via ORAL
  Filled 2018-07-26 (×7): qty 1

## 2018-07-26 MED ORDER — NALBUPHINE HCL 10 MG/ML IJ SOLN
10.0000 mg | Freq: Once | INTRAMUSCULAR | Status: AC
  Administered 2018-07-26: 10 mg via SUBCUTANEOUS
  Filled 2018-07-26: qty 1

## 2018-07-26 MED ORDER — LABETALOL HCL 100 MG PO TABS
100.0000 mg | ORAL_TABLET | Freq: Three times a day (TID) | ORAL | Status: DC
  Administered 2018-07-26 – 2018-07-27 (×4): 100 mg via ORAL
  Filled 2018-07-26 (×4): qty 1

## 2018-07-26 NOTE — Lactation Note (Signed)
This note was copied from a baby's chart. Lactation Consultation Note  Patient Name: Krista Rodgers [id number] Date: 07/26/2018 Reason for consult: Follow-up assessment;Early term 37-38.6wks;Infant weight loss(6% weight loss/ baby in the treatment nursery for a Circ at this  time / see LC note for details )  11-7 baby received all bottles, volume range 5- 20 ml. ( per RN mom aware that the baby should take at least 30 ML or greater )  @ the [facility] consult LC explored some options with mom taking in to consideration ET, less than 6 pounds now, jaundice.  Option #1 - if baby is wide awake, and hungry - latch at the breast 15 -20 mins , and supplement him back with at least 30 ml of EBM or formula. Make sure he is settled and Then post pump both breast for 10 -15 mins with your Medela DEBP. Next feeding switch to the other breast.  Option #2 - if its been 3 hours and the baby is sluggish to feed , feed appetizer of EBM or  Formula ( 10 ml )- PACE  Feed and then latch the baby feed 15 - 20 mins ( 30 mins ) and then supplement 30 ml. Post- pump  both breast for 10 - 15 mins.  Option #3 - pump and bottle feed, ( today 30 ml , gradually increase to 45-60 by the end of the week.  Sore nipples and engorgement prevention and tx .  Per mom has DEBP Medela.  LC recommended and offered to place a request in the [facility] clinic for [facility] O/P appt. In 7 days.  Mom receptive.  Mother informed of post-discharge support and given phone number to the lactation department, including services for phone call assistance; out-patient appointments; and breastfeeding support group. List of other breastfeeding resources in the community given in the handout. Encouraged mother to call for problems or concerns related to breastfeeding.   Maternal Data    Feeding Feeding Type: (baby received bottles all night ) Nipple Type: Slow - flow  LATCH Score                   Interventions Interventions: Breast feeding basics  reviewed;DEBP  Lactation Tools Discussed/Used Tools: Pump Breast pump type: Double-Electric Breast Pump   Consult Status Consult Status: Follow-up Date: (LC offered to place a request in the [facility] for [facility] O/P appt and mom receptive ) Follow-up type: Out-patient    [name] 07/26/2018, 11:19 AM

## 2018-07-26 NOTE — Progress Notes (Signed)
Marlinda Mike called and updated on patients status. Patient ate, drank water, took a percocet, and took an hour to hour and a half nap. Patient reports her headache is better, but it is still there. Her BP remains elevated at 132/96. No new orders. Advised to continue with current plan of care. Royston Cowper, RN

## 2018-07-26 NOTE — Progress Notes (Addendum)
POSTOPERATIVE DAY # 3 S/P CS   S:         Reports feeling tired for not sleeping - reports headache since last night             Tolerating po intake / no nausea / no vomiting / + flatus / no BM             Bleeding is light             Pain controlled with Motrin and Oxycodone             Up ad lib / ambulatory/ voiding QS  Newborn Breast   O:  VS: BP (!) 138/96   Pulse 86   Temp 98.4 F (36.9 C) (Oral)   Resp 18   Ht 5\' 1"  (1.549 m)   Wt 76.7 kg   LMP 11/01/2017 (Exact Date)   SpO2 99%   BMI 31.94 kg/m                BP: 138/96 - 136/93 - 155/88  (Labetalol 100 BID - only held x 1 AM dose yesterday with low BP)   Bloodtype: --/--/A POS, A POS Performed at [facility], [address], [place], [place] [address]  [phone])  Rubella: Immune (04/15 0000)                                Physical Exam:             Alert and Oriented X3  Lungs: Clear and unlabored  Heart: regular rate and rhythm / no mumurs  Abdomen: soft, non-tender, non-distended, active BS             Fundus: firm, non-tender, Ueven             Dressing intact honeycomb              Incision:  approximated with suture / no erythema / no ecchymosis / no drainage  Perineum: intact  Lochia: light  Extremities: trace edema, no calf pain or tenderness  A:        POD # 3 S/P CS            gestational hypertension with new onset persistent headache            ABL anemia  P:        Routine postoperative care              check CMP and CBC / start iron and magnesium / labetalol 100 TID              consult with MD after labs reviewed to evaluate for development of PP PEC             give single medication for headache / rest then re-evaluate - Nubain 10 Sub Q   Susa Loffler, MSN, [facility] 07/26/2018, 10:14 AM   Addendum @ 1145AM: consult with Dr Cherly Hensen - add hydralizine 50mg  Q8

## 2018-07-26 NOTE — Progress Notes (Addendum)
CSW received consult due to score 11 on Edinburgh Depression Screen.    CSW met with MOB via bedside to provide any supports needed. MOB was quiet but pleasant during assessment. MOB voiced having an easy delivery. MOB voiced no concerns at this time. CSW questioned if MOB had any anxiety/ depression during pregnancy- MOB stated anxiety about being a new mom. MOB voiced having many supports including FOB and family that she is able to speak with openly regarding her feelings. MOB also stated she feels comfortable speaking openly with her OB GYN if needed.   CSW provided education regarding Baby Blues vs PMADs and provided MOB with resources for mental health follow up.  CSW encouraged MOB to evaluate her mental health throughout the postpartum period with the use of the New Mom Checklist developed by Postpartum [employer] as well as the Edinburgh Postnatal Depression Scale and notify a medical professional if symptoms arise.    FOB was present at bedside and was actively listening during conversation regarding baby blues vs. PMADS.   Tres Grzywacz, LCSW Clinical Social Worker  [facility]  [phone]  

## 2018-07-26 NOTE — Progress Notes (Signed)
Patient reports worsening headache, "foggy" vision, and has a BP of 142/104. No swelling, negative clonus and normal reflexes on assessment. Marlinda Mike, CNM notified. Encouraged to get patient to eat, drink water, and give scheduled dose of labetalol and recheck BP in 2 hours. Will continue to monitor. Royston Cowper, RN

## 2018-07-27 LAB — COMPREHENSIVE METABOLIC PANEL
ALT: 16 U/L (ref 0–44)
AST: 26 U/L (ref 15–41)
Albumin: 2.5 g/dL — ABNORMAL LOW (ref 3.5–5.0)
Alkaline Phosphatase: 74 U/L (ref 38–126)
Anion gap: 7 (ref 5–15)
BUN: 9 mg/dL (ref 6–20)
CHLORIDE: 107 mmol/L (ref 98–111)
CO2: 24 mmol/L (ref 22–32)
CREATININE: 0.85 mg/dL (ref 0.44–1.00)
Calcium: 8.5 mg/dL — ABNORMAL LOW (ref 8.9–10.3)
Glucose, Bld: 92 mg/dL (ref 70–99)
POTASSIUM: 3.8 mmol/L (ref 3.5–5.1)
Sodium: 138 mmol/L (ref 135–145)
TOTAL PROTEIN: 5.5 g/dL — AB (ref 6.5–8.1)
Total Bilirubin: 0.9 mg/dL (ref 0.3–1.2)

## 2018-07-27 LAB — CBC
HCT: 30.8 % — ABNORMAL LOW (ref 36.0–46.0)
Hemoglobin: 10.4 g/dL — ABNORMAL LOW (ref 12.0–15.0)
MCH: 26.4 pg (ref 26.0–34.0)
MCHC: 33.8 g/dL (ref 30.0–36.0)
MCV: 78.2 fL — AB (ref 80.0–100.0)
PLATELETS: 255 10*3/uL (ref 150–400)
RBC: 3.94 MIL/uL (ref 3.87–5.11)
RDW: 15.4 % (ref 11.5–15.5)
WBC: 13.3 10*3/uL — ABNORMAL HIGH (ref 4.0–10.5)
nRBC: 0 % (ref 0.0–0.2)

## 2018-07-27 LAB — PREPARE RBC (CROSSMATCH)

## 2018-07-27 MED ORDER — SODIUM CHLORIDE 0.9 % IV SOLN
510.0000 mg | Freq: Once | INTRAVENOUS | Status: AC
  Administered 2018-07-27: 510 mg via INTRAVENOUS
  Filled 2018-07-27: qty 17

## 2018-07-27 MED ORDER — ACETAMINOPHEN 325 MG PO TABS: 650.0000 mg | ORAL_TABLET | Freq: Once | ORAL | Status: DC

## 2018-07-27 MED ORDER — SODIUM CHLORIDE 0.9% IV SOLUTION: Freq: Once | INTRAVENOUS | Status: DC

## 2018-07-27 MED ORDER — LABETALOL HCL 200 MG PO TABS
200.0000 mg | ORAL_TABLET | Freq: Three times a day (TID) | ORAL | Status: DC
  Administered 2018-07-27 – 2018-07-28 (×2): 200 mg via ORAL
  Filled 2018-07-27 (×2): qty 1

## 2018-07-27 MED ORDER — DIPHENHYDRAMINE HCL 25 MG PO CAPS
25.0000 mg | ORAL_CAPSULE | Freq: Once | ORAL | Status: AC
  Administered 2018-07-27: 25 mg via ORAL
  Filled 2018-07-27: qty 1

## 2018-07-27 NOTE — Progress Notes (Addendum)
POSTOPERATIVE DAY # 4 S/P Primary LTCS for arrest of dilation, baby boy "Zayn" Gestational Hypertension    S:         Reports feeling bad; reports headache frontal lobe that wraps around head since Sunday - states no medication has helped; reports light sensitivity; states lying flat makes HA worse; also reports heart palpitations              Tolerating po intake / no nausea / no vomiting / + flatus / + BM  Denies dizziness, SOB, or CP             Bleeding is light             Pain controlled with Motrin and Percocet             Up ad lib / ambulatory/ voiding QS  Newborn formula feeding  / Circumcision - completed yesterday   O:  VS: BP (!) 152/95 (BP Location: Right Arm)   Pulse 82   Temp 98.7 F (37.1 C) (Oral)   Resp 18   Ht 5\' 1"  (1.549 m)   Wt 76.7 kg   LMP 11/01/2017 (Exact Date)   SpO2 100%   BMI 31.94 kg/m   Vitals:   07/26/18 2252 07/27/18 0112 07/27/18 0434 07/27/18 0500  BP: (!) 139/93 (!) 127/91 (!) 166/106 (!) 152/95  Pulse: 85 80 73 82  Resp: 18  18   Temp: 97.9 F (36.6 C)  98.7 F (37.1 C)   TempSrc: Oral  Oral   SpO2:   100%   Weight:      Height:         LABS:               Recent Labs    07/26/18 1139  WBC 11.7*  HGB 7.4*  PLT 190               Bloodtype: --/--/A POS, A POS Performed at [facility], [address], [place], [place] [address]  (10/17 1611)  Rubella: Immune (04/15 0000)                                             I&O: Intake/Output    None    Flu and Tdap UTD             Physical Exam:             Alert and Oriented X3  Lungs: Clear and unlabored  Heart: regular rate and rhythm / no murmurs  Abdomen: soft, non-tender, non-distended, active bowel sounds in all quadrants             Fundus: firm, non-tender, U-2             Dressing: honeycomb with steri-strips c/d/i              Incision:  approximated with sutures / no erythema / no ecchymosis / no drainage  Perineum: intact  Lochia: small, no clots    Extremities: trace pedal edema, no calf pain or tenderness, +1DTRs, no clonus bilaterally  A/P:     POD # 4 S/P Primary LTCS for arrest of dilation, presumed chorioamnionitis            Gestational Hypertension   - Labile BPs   - Headache since Sunday with blurry vision, sensitivity to light, and  heart palpitations - labs WNL; likely related to anemia    - Labetalol 100mg  TID   - Hydralazine 50mg  q8hrs  Symptomatic ABL Anemia    - Transfuse 2 units PRBCs   - Keep 2 units ahead   - CBC 4 hrs post transfusion    - Premedicate with Benadryl and Tylenol; do not exceed 4000mg  of Tylenol in 24 hrs    - IV Iron at 6pm today    - Counseled on risks/benefits of blood transfusion; sign consent          Routine postoperative care             Will keep inpatient today   Call for severe range BPs   Strict I&O  Daily weight   Reassess for discharge in the morning   Consult for plan: Dr. Nelle Don, MSN, CNM [facility]

## 2018-07-27 NOTE — Lactation Note (Signed)
This note was copied from a baby's chart. Lactation Consultation Note  Patient Name: Krista Rodgers [id number] Date: 07/27/2018 Reason for consult: Follow-up assessment;1st time breastfeeding;Primapara;Early term 45-38.6wks  Visited with P1 Mom of ET infant at 12 hrs old.  Mom continues to feel poorly with headache.  Mom lying on her side with tears flowing down her cheeks.  Mom has not pumped due to her feeling badly.  Breasts are filling, but not engorged.  Offered to help pump, but Mom not wanting to.  Mom has GHTN and some elevated BPs.   Mom's Hgb 7.4, and Mom to receive 1 unit of PRBC's. Mom to let her RN know if she decides to start back pumping.   Baby is being fed formula by bottle for now.  To call for assistance as needed. Consult Status Consult Status: Follow-up Date: 07/28/18 Follow-up type: In-patient    [name] 07/27/2018, 9:55 AM

## 2018-07-28 LAB — BPAM RBC
Blood Product Expiration Date: 201911032359
Blood Product Expiration Date: 201911032359
ISSUE DATE / TIME: 201910221126
ISSUE DATE / TIME: 201910221439
Unit Type and Rh: 5100
Unit Type and Rh: 9500

## 2018-07-28 LAB — TYPE AND SCREEN
ABO/RH(D): A POS
Antibody Screen: NEGATIVE
Unit division: 0
Unit division: 0

## 2018-07-28 MED ORDER — DIPHENHYDRAMINE HCL 25 MG PO CAPS: 25.0000 mg | ORAL_CAPSULE | ORAL | Status: DC | PRN

## 2018-07-28 MED ORDER — SENNOSIDES-DOCUSATE SODIUM 8.6-50 MG PO TABS: 2.0000 | ORAL_TABLET | ORAL | 0 refills | Status: AC

## 2018-07-28 MED ORDER — NALBUPHINE HCL 10 MG/ML IJ SOLN: 5.0000 mg | INTRAMUSCULAR | Status: DC | PRN

## 2018-07-28 MED ORDER — SODIUM CHLORIDE 0.9% FLUSH: 3.0000 mL | INTRAVENOUS | Status: DC | PRN

## 2018-07-28 MED ORDER — ONDANSETRON HCL 4 MG/2ML IJ SOLN: 4.0000 mg | Freq: Three times a day (TID) | INTRAMUSCULAR | Status: DC | PRN

## 2018-07-28 MED ORDER — NALBUPHINE HCL 10 MG/ML IJ SOLN: 5.0000 mg | Freq: Once | INTRAMUSCULAR | Status: DC | PRN

## 2018-07-28 MED ORDER — DIPHENHYDRAMINE HCL 50 MG/ML IJ SOLN: 12.5000 mg | INTRAMUSCULAR | Status: DC | PRN

## 2018-07-28 MED ORDER — LABETALOL HCL 200 MG PO TABS: 200.0000 mg | ORAL_TABLET | Freq: Three times a day (TID) | ORAL | 3 refills | Status: AC

## 2018-07-28 MED ORDER — FERROUS SULFATE 325 (65 FE) MG PO TABS: 325.0000 mg | ORAL_TABLET | Freq: Every day | ORAL | 3 refills | Status: AC

## 2018-07-28 MED ORDER — NALOXONE HCL 0.4 MG/ML IJ SOLN: 0.4000 mg | INTRAMUSCULAR | Status: DC | PRN

## 2018-07-28 MED ORDER — HYDRALAZINE HCL 50 MG PO TABS: 50.0000 mg | ORAL_TABLET | Freq: Three times a day (TID) | ORAL | 3 refills | Status: AC

## 2018-07-28 MED ORDER — NALOXONE HCL 4 MG/10ML IJ SOLN
1.0000 ug/kg/h | INTRAVENOUS | Status: DC | PRN
  Filled 2018-07-28: qty 5

## 2018-07-28 MED ORDER — SCOPOLAMINE 1 MG/3DAYS TD PT72
1.0000 | MEDICATED_PATCH | Freq: Once | TRANSDERMAL | Status: DC
  Filled 2018-07-28: qty 1

## 2018-07-28 MED ORDER — OXYCODONE-ACETAMINOPHEN 5-325 MG PO TABS: 1.0000 | ORAL_TABLET | ORAL | 0 refills | Status: AC | PRN

## 2018-07-28 NOTE — Lactation Note (Signed)
This note was copied from a baby's chart. Lactation Consultation Note  Patient Name: Krista Rodgers [id number] Date: 07/28/2018   RN taking care of mother stated Mom didn't need to see [facility] as she is exclusively formula feeding.   Consult Status Consult Status: Complete Date: 07/28/18 Follow-up type: Call as needed    [name] 07/28/2018, 10:05 AM

## 2018-07-28 NOTE — Progress Notes (Addendum)
POSTOPERATIVE DAY # 5 S/P Primary LTCS for arrest of dilation, baby boy "Zayn" Chronic Hypertension with superimposed Gestational Hypertension    S:         Reports feeling much better today; denies HA, visual disturbances, RUQ/epigastric pain.  She denies heart palpitations and states all of her symptoms resolved after receiving the blood transfusion.  She denies s/s of depression - reports she now feels like she can adjust to parenthood better now that she is feeling well.  Baby went to nursery overnight, so mom could rest.              Tolerating po intake / no nausea / no vomiting / + flatus / no BM  Denies dizziness, SOB, or CP             Bleeding is light             Pain controlled with Motrin and Percocet             Up ad lib / ambulatory/ voiding QS  Newborn formula feeding - however, mom states milk has come in and she would like to resume breastfeeding and pumping / Circumcision - completed on 07/26/18   O:  VS: BP (!) 138/97 (BP Location: Right Arm)   Pulse 97   Temp 98.5 F (36.9 C) (Oral)   Resp 16   Ht 5\' 1"  (1.549 m)   Wt 74.5 kg   LMP 11/01/2017 (Exact Date)   SpO2 99%   BMI 31.03 kg/m   Vitals:   07/27/18 2145 07/28/18 0406 07/28/18 0605 07/28/18 0609  BP: (!) 149/99 (!) 142/91 (!) 138/97   Pulse: 81 70  97  Resp: 18 16    Temp: 98.5 F (36.9 C) 98.5 F (36.9 C)    TempSrc: Oral Oral    SpO2: 99% 99%    Weight:      Height:        LABS:               Results for orders placed or performed during the hospital encounter of 07/22/18 (from the past 24 hour(s))  CBC     Status: Abnormal   Collection Time: 07/27/18  9:39 PM  Result Value Ref Range   WBC 13.3 (H) 4.0 - 10.5 K/uL   RBC 3.94 3.87 - 5.11 MIL/uL   Hemoglobin 10.4 (L) 12.0 - 15.0 g/dL   HCT 60.4 (L) 54.0 - 98.1 %   MCV 78.2 (L) 80.0 - 100.0 fL   MCH 26.4 26.0 - 34.0 pg   MCHC 33.8 30.0 - 36.0 g/dL   RDW 19.1 47.8 - 29.5 %   Platelets 255 150 - 400 K/uL   nRBC 0.0 0.0 - 0.2 %  CMP      Status: Abnormal   Collection Time: 07/27/18  9:39 PM  Result Value Ref Range   Sodium 138 135 - 145 mmol/L   Potassium 3.8 3.5 - 5.1 mmol/L   Chloride 107 98 - 111 mmol/L   CO2 24 22 - 32 mmol/L   Glucose, Bld 92 70 - 99 mg/dL   BUN 9 6 - 20 mg/dL   Creatinine, Ser 6.21 0.44 - 1.00 mg/dL   Calcium 8.5 (L) 8.9 - 10.3 mg/dL   Total Protein 5.5 (L) 6.5 - 8.1 g/dL   Albumin 2.5 (L) 3.5 - 5.0 g/dL   AST 26 15 - 41 U/L   ALT 16 0 - 44 U/L   Alkaline Phosphatase  74 38 - 126 U/L   Total Bilirubin 0.9 0.3 - 1.2 mg/dL   GFR calc non Af Amer >60 >60 mL/min   GFR calc Af Amer >60 >60 mL/min   Anion gap 7 5 - 15               Bloodtype: --/--/A POS (10/22 1012)  Rubella: Immune (04/15 0000)                                             I&O: Intake/Output      10/22 0701 - 10/23 0700 10/23 0701 - 10/24 0700   P.O. 960    Blood 690    Total Intake(mL/kg) 1650 (22.1)    Urine (mL/kg/hr) 1725 (1)    Total Output 1725    Net -75          Flu and Tdap UTD            Physical Exam:             Alert and Oriented X3  Lungs: Clear and unlabored  Heart: regular rate and rhythm / no murmurs  Abdomen: soft, non-tender, non-distended, active bowel sounds in all quadrants             Fundus: firm, non-tender, U-2             Dressing: honeycomb dressing with steri-strips c/d/i              Incision:  approximated with sutures / no erythema / no ecchymosis / no drainage  Perineum: intact  Lochia: small, no clots  Extremities: no edema, no calf pain or tenderness,   A/P:     POD # 5 S/P Primary LTCS for arrest of dilation, presumed chorioamnionitis            Chronic Hypertension with superimposed Gestational Hypertension                         - Labile BPs, but improving overnight                         - No neural s/s, labs stable, no evidence of pre-eclampsia                          - Labetalol 200mg  q8hrs                         - Hydralazine 50mg  q8hrs             Symptomatic ABL  Anemia - markedly improved                         - s/p 2 units PRBCs and IV Feraheme                         - Labs improved  EPDS score 11   - s/p Social work consult   - Reviewed s/s of postpartum depression and anxiety and advised to monitor closely   - Recommend early f/u if symptoms persist past 2 weeks            Routine postoperative care   Remove honeycomb dressing prior to discharge  Discharge home today  WOB discharge book given and warning s/s and instructions reviewed. Pre-eclampsia warning s/s reviewed   Needs BP check in 1 week with [facility] nurse - please schedule prior to discharge.  Have them notify [facility] with BP results to determine next office visit - will likely need next visit at 2 weeks PP. 6 week PP visit with Dr. Cherly Hensen.              Consult for plan: Dr. Nelle Don, MSN, [facility]

## 2018-07-28 NOTE — Discharge Summary (Addendum)
Obstetric Discharge Summary   Patient Name: Krista Rodgers DOB: [birth year] MRN: [id number]  Date of Admission: 07/22/2018 Date of Discharge: 07/28/2018 Date of Delivery: 10/18 Gestational Age at Delivery: [redacted]w[redacted]d  Primary OB: [facility] - Dr. Cherly Hensen  Antepartum complications:  - ? Remote hx of Chronic Hypertension - GBS Positive   Prenatal Labs:  ABO, Rh:  A positive Antibody:  negative Rubella:  Immune RPR:   NR HBsAg:   neg HIV: Non Reactive (03/05 1602)  GBS:   positive Admitting Diagnosis: IOL for gestational HTN  Secondary Diagnoses: Patient Active Problem List   Diagnosis Date Noted  . Acute blood loss anemia 07/26/2018  . Postpartum care following cesarean delivery (10/18) 07/24/2018  . Chorioamnionitis 07/24/2018  . Non-reactive NST (non-stress test) 07/22/2018  . Gestational hypertension w/o significant proteinuria in 3rd trimester 07/22/2018  . Abdominal pain during pregnancy in second trimester 05/05/2018  . Essential hypertension 11/29/2014     Augmentation: AROM, Intracervical balloon, Pitocin  Complications: presumed chorioamnionitis, arrest of dilation  Date of Delivery: 07/23/18 Delivered By: Dr. Rayetta Humphrey. Renae Fickle CNM assist Delivery Type: primary cesarean section, low transverse incision Anesthesia: epidural  Newborn Data: Live born female  Birth Weight: 6 lb 4.9 oz (2860 g) APGAR: 9, 10  Newborn Delivery   Birth date/time:  07/23/2018 21:49:00 Delivery type:  C-Section, Low Transverse Trial of labor:  Yes C-section categorization:  Primary        Hospital/Postpartum Course  (Cesarean Section): Pt. Admitted for IOL due to elevated BPs after other antepartum testing (  NR NST BPP 8/8) was negative. She was induced at 37+4 weeks for persistent elevated BPs. See notes for details.  Patient's postpartum course was complicated by elevated blood pressures without pre-eclampsia and symptomatic anemia requiring extended 2 day stay in the hospital  and a blood transfusion of 2 units PRBCs on POD#4. By time of discharge on POD#5, her pain was controlled on oral pain medications, her headache and heart palpitations have resolved, her blood pressures are becoming more stable on oral anti-hypertensives, she had appropriate lochia and was ambulating, voiding without difficulty, tolerating regular diet and passing flatus.  See notes for details. She was deemed stable for discharge to home.     Labs: CBC Latest Ref Rng & Units 07/27/2018 07/26/2018 07/24/2018  WBC 4.0 - 10.5 K/uL 13.3(H) 11.7(H) 26.9(H)  Hemoglobin 12.0 - 15.0 g/dL 10.4(L) 7.4(L) 8.6(L)  Hematocrit 36.0 - 46.0 % 30.8(L) 22.8(L) 26.8(L)  Platelets 150 - 400 K/uL 255 190 197   A POS  Physical exam:  BP (!) 138/97 (BP Location: Right Arm)   Pulse 97   Temp 98.5 F (36.9 C) (Oral)   Resp 16   Ht 5\' 1"  (1.549 m)   Wt 74.5 kg   LMP 11/01/2017 (Exact Date)   SpO2 99%   BMI 31.03 kg/m  General: alert and no distress Pulm: normal respiratory effort Lochia: appropriate Abdomen: soft, NT Uterine Fundus: firm, below umbilicus Perineum: healing well, no significant erythema, no significant edema Incision: c/d/i, healing well, no significant drainage, no dehiscence, no significant erythema Extremities: No evidence of DVT seen on physical exam. No lower extremity edema.   Disposition: stable, discharge to home Baby Feeding: breast milk and formula Baby Disposition: home with mom  Contraception: did not discuss  Rh Immune globulin given: N/A Rubella vaccine given: N/A Tdap vaccine given in AP or PP setting: UTD Flu vaccine given in AP or PP setting: UTD   Plan:  Adiah Guereca was discharged  to home in good condition. Follow-up appointment at [facility] in 1 week for BP check with [facility], then with Dr. Cherly Hensen.   Discharge Instructions: Per After Visit Summary. Refer to After Visit Summary and [facility] discharge booklet  Activity:  Advance as tolerated. Pelvic rest for 6 weeks.   Diet: Regular, Heart Healthy Discharge Medications: Allergies as of 07/28/2018      Reactions   Toradol [ketorolac Tromethamine] Hives   Amoxicillin Hives   Has patient had a PCN reaction causing immediate rash, facial/tongue/throat swelling, SOB or lightheadedness with hypotension: Yes Has patient had a PCN reaction causing severe rash involving mucus membranes or skin necrosis: Unk Has patient had a PCN reaction that required hospitalization: Yes Has patient had a PCN reaction occurring within the last 10 years: Yes If all of the above answers are "NO", then may proceed with Cephalosporin use.   Peach [prunus Persica] Hives      Medication List    TAKE these medications   ferrous sulfate 325 (65 FE) MG tablet Take 1 tablet (325 mg total) by mouth daily with breakfast.   hydrALAZINE 50 MG tablet Commonly known as:  APRESOLINE Take 1 tablet (50 mg total) by mouth every 8 (eight) hours.   labetalol 200 MG tablet Commonly known as:  NORMODYNE Take 1 tablet (200 mg total) by mouth every 8 (eight) hours.   oxyCODONE-acetaminophen 5-325 MG tablet Commonly known as:  PERCOCET/ROXICET Take 1 tablet by mouth every 4 (four) hours as needed (pain scale 4-7).   PRENATAL VITAMIN PLUS LOW IRON 27-1 MG Tabs Take 1 tablet by mouth daily.   senna-docusate 8.6-50 MG tablet Commonly known as:  Senokot-S Take 2 tablets by mouth daily. Start taking on:  07/29/2018            Discharge Care Instructions  (From admission, onward)         Start     Ordered   07/28/18 0000  Discharge wound care:    Comments:  Remove dressing today prior to discharge.  Keep incision clean and dry. Steri-strip will come off in 1 week   07/28/18 1043         Outpatient follow up:  Follow-up Information    Maxie Better, MD. Schedule an appointment as soon as possible for a visit in 1 week(s).   Specialty:  Obstetrics and Gynecology Why:  [facility] appointment for BP check in 1 week - please notify [facility] of results and schedule f/u appt with Dr. Cherly Hensen; 6 week postpartum visit Contact information: [address] [place] [address] [phone]           Signed:  Carlean Jews, MSN, CNM [facility]

## 2018-10-31 ENCOUNTER — Encounter (HOSPITAL_COMMUNITY): Payer: Self-pay

## 2018-11-04 ENCOUNTER — Other Ambulatory Visit: Payer: Self-pay

## 2018-11-04 ENCOUNTER — Emergency Department (HOSPITAL_COMMUNITY)
Admission: EM | Admit: 2018-11-04 | Discharge: 2018-11-04 | Disposition: A | Discharge status: Home / Self Care | Payer: 59 | Attending: Emergency Medicine | Admitting: Emergency Medicine

## 2018-11-04 ENCOUNTER — Emergency Department (HOSPITAL_COMMUNITY): Payer: 59

## 2018-11-04 DIAGNOSIS — M25532 Pain in left wrist: Secondary | ICD-10-CM | POA: Insufficient documentation

## 2018-11-04 DIAGNOSIS — Z79899 Other long term (current) drug therapy: Secondary | ICD-10-CM | POA: Insufficient documentation

## 2018-11-04 DIAGNOSIS — I10 Essential (primary) hypertension: Secondary | ICD-10-CM | POA: Diagnosis not present

## 2018-11-04 LAB — POC URINE PREG, ED: Preg Test, Ur: NEGATIVE

## 2018-11-04 MED ORDER — ACETAMINOPHEN 325 MG PO TABS
650.0000 mg | ORAL_TABLET | Freq: Once | ORAL | Status: AC
  Administered 2018-11-04: 650 mg via ORAL
  Filled 2018-11-04: qty 2

## 2018-11-04 NOTE — ED Triage Notes (Signed)
Pt reports she tripped and fell into a door against her left hand 2 hours ago. Pt denies syncope. Pt denies pain, but states it feels tingly/numb. Redness present to wrist.

## 2018-11-04 NOTE — ED Notes (Signed)
Patient verbalizes understanding of discharge instructions. Opportunity for questioning and answers were provided. Armband removed by staff, pt discharged from ED. Pt ambulatory to lobby. Follow up care reviewed 

## 2018-11-04 NOTE — ED Provider Notes (Signed)
[facility] EMERGENCY DEPARTMENT Provider Note   CSN: [id number] Arrival date & time: 11/04/18  1449     History   Chief Complaint Chief Complaint  Patient presents with  . Wrist Pain    HPI Velinda Rosenkrantz is a 28 y.o. female with a PMH of HTN and HLD presenting with left wrist pain after tripping and falling into the door 2 hours ago. Patient reports this was an accident. Patient reports constant pain that she describes as soreness. Patient states pain is worse with movement and better with rest. Patient states pain radiates to her left thumb and left index finger. Patient reports associated numbness, weakness, and paresthesias since the incident. Patient denies taking any medications. Patient states she is right handed dominant. Patient reports mild edema and erythema. Patient denies bleeding or wounds. Patient denies hitting head or LOC. Patient denies fever, chills, nausea, vomiting, or abdominal pain. Patient denies pain on her elbow or shoulder. Patient is unsure of her LMP due to her irregular menstrual cycle.   HPI  Past Medical History:  Diagnosis Date  . Hyperlipidemia   . Hypertension     Patient Active Problem List   Diagnosis Date Noted  . Acute blood loss anemia 07/26/2018  . Postpartum care following cesarean delivery (10/18) 07/24/2018  . Chorioamnionitis 07/24/2018  . Non-reactive NST (non-stress test) 07/22/2018  . Gestational hypertension w/o significant proteinuria in 3rd trimester 07/22/2018  . Abdominal pain during pregnancy in second trimester 05/05/2018  . Essential hypertension 11/29/2014    Past Surgical History:  Procedure Laterality Date  . CESAREAN SECTION N/A 07/23/2018   Procedure: CESAREAN SECTION;  Surgeon: Maxie Better, MD;  Location: [facility] BIRTHING SUITES;  Service: Obstetrics;  Laterality: N/A;  . NO PAST SURGERIES       OB History    Gravida  3   Para      Term      Preterm      AB  2   Living  0     SAB  2   TAB      Ectopic      Multiple      Live Births               Home Medications    Prior to Admission medications   Medication Sig Start Date End Date Taking? Authorizing Provider  ferrous sulfate 325 (65 FE) MG tablet Take 1 tablet (325 mg total) by mouth daily with breakfast. 07/28/18 07/28/19  Sigmon, Scarlette Slice, CNM  hydrALAZINE (APRESOLINE) 50 MG tablet Take 1 tablet (50 mg total) by mouth every 8 (eight) hours. 07/28/18   Sigmon, Scarlette Slice, CNM  labetalol (NORMODYNE) 200 MG tablet Take 1 tablet (200 mg total) by mouth every 8 (eight) hours. 07/28/18   Sigmon, Scarlette Slice, CNM  oxyCODONE-acetaminophen (PERCOCET/ROXICET) 5-325 MG tablet Take 1 tablet by mouth every 4 (four) hours as needed (pain scale 4-7). 07/28/18   Sigmon, Scarlette Slice, CNM  Prenatal Vit-Fe Fumarate-FA (PRENATAL VITAMIN PLUS LOW IRON) 27-1 MG TABS Take 1 tablet by mouth daily. 12/04/17   Conan Bowens, MD  senna-docusate (SENOKOT-S) 8.6-50 MG tablet Take 2 tablets by mouth daily. 07/29/18   Karena Addison, CNM    Family History No family history on file.  Social History Social History   Tobacco Use  . Smoking status: Never Smoker  . Smokeless tobacco: Never Used  Substance Use Topics  . Alcohol use: No  . Drug use: No  Allergies   Toradol [ketorolac tromethamine]; Amoxicillin; and Peach [prunus persica]   Review of Systems Review of Systems  Constitutional: Negative for chills, diaphoresis and fever.  Eyes: Negative for visual disturbance.  Respiratory: Negative for shortness of breath.   Cardiovascular: Negative for chest pain.  Gastrointestinal: Negative for abdominal pain, nausea and vomiting.  Endocrine: Negative for cold intolerance and heat intolerance.  Genitourinary: Negative for dysuria.  Musculoskeletal: Positive for arthralgias and joint swelling. Negative for gait problem and neck pain.  Skin: Negative for rash and wound.  Allergic/Immunologic: Negative for  immunocompromised state.  Neurological: Positive for weakness and numbness. Negative for dizziness and syncope.  Hematological: Negative for adenopathy.     Physical Exam Updated Vital Signs BP (!) 141/101   Pulse 88   Temp 98.2 F (36.8 C) (Oral)   Resp 16   Ht 5' (1.524 m)   Wt 63.5 kg   SpO2 100%   BMI 27.34 kg/m   Physical Exam Vitals signs and nursing note reviewed.  Constitutional:      General: She is not in acute distress.    Appearance: She is well-developed. She is not diaphoretic.  HENT:     Head: Normocephalic and atraumatic.  Neck:     Musculoskeletal: Normal range of motion and neck supple.  Cardiovascular:     Rate and Rhythm: Normal rate and regular rhythm.     Heart sounds: Normal heart sounds. No murmur. No friction rub. No gallop.   Pulmonary:     Effort: Pulmonary effort is normal. No respiratory distress.     Breath sounds: Normal breath sounds.  Abdominal:     Palpations: Abdomen is soft.     Tenderness: There is no abdominal tenderness.  Musculoskeletal:        General: No deformity.     Left shoulder: Normal. She exhibits normal range of motion, no tenderness and no bony tenderness.     Left elbow: Normal. She exhibits normal range of motion, no swelling and no effusion.     Right wrist: Normal. She exhibits normal range of motion, no tenderness and no bony tenderness.     Left wrist: She exhibits decreased range of motion, tenderness, bony tenderness and swelling.     Right hand: She exhibits normal range of motion, no tenderness and no bony tenderness.     Left hand: She exhibits decreased range of motion, tenderness and bony tenderness.     Comments: Mild erythema and edema noted over the radial aspect of left wrist. Decreased ROM with flexion and extension of left wrist. Decreased ROM with flexion of left thumb and index finger. Decreased strength of left wrist and left hand due to pain. Scaphoid tenderness present. Sensation intact. 2+ radial  pulses.   Skin:    General: Skin is warm.     Coloration: Skin is not pale.     Findings: No erythema or rash.  Neurological:     Mental Status: She is alert and oriented to person, place, and time.      ED Treatments / Results  Labs (all labs ordered are listed, but only abnormal results are displayed) Labs Reviewed  POC URINE PREG, ED    EKG None  Radiology Dg Wrist Complete Left  Result Date: 11/04/2018 CLINICAL DATA:  Pain after fall. EXAM: LEFT WRIST - COMPLETE 3+ VIEW COMPARISON:  None. FINDINGS: There is no evidence of fracture or dislocation. There is no evidence of arthropathy or other focal bone abnormality. Soft tissues  are unremarkable. IMPRESSION: Negative. Electronically Signed   By: Gerome Samavid  Williams III M.D   On: 11/04/2018 16:43   Dg Hand Complete Left  Result Date: 11/04/2018 CLINICAL DATA:  Pain after fall. EXAM: LEFT HAND - COMPLETE 3+ VIEW COMPARISON:  None. FINDINGS: There is no evidence of fracture or dislocation. There is no evidence of arthropathy or other focal bone abnormality. Soft tissues are unremarkable. IMPRESSION: Negative. Electronically Signed   By: Gerome Samavid  Williams III M.D   On: 11/04/2018 16:45    Procedures Procedures (including critical care time)  Medications Ordered in ED Medications  acetaminophen (TYLENOL) tablet 650 mg (650 mg Oral Given 11/04/18 1539)     Initial Impression / Assessment and Plan / ED Course  I have reviewed the triage vital signs and the nursing notes.  Pertinent labs & imaging results that were available during my care of the patient were reviewed by me and considered in my medical decision making (see chart for details).  Clinical Course as of Nov 05 1707  Thu Nov 04, 2018  1649 There is no evidence of fracture or dislocation.  DG Hand Complete Left [AH]  1649 Negative for acute fracture or dislocation.  DG Wrist Complete Left [AH]    Clinical Course User Index [AH] Leretha DykesHernandez, Christie Viscomi P, PA-C   Patient X-Ray  negative for obvious fracture or dislocation. Pain managed in ED. Pt advised to follow up with orthopedics if symptoms persist for possibility of missed fracture diagnosis. Patient given brace while in ED, conservative therapy recommended and discussed. Discussed elevated BP with patient. Patient denies any symptoms. Encouraged patient to follow up with PCP regarding further management of BP. Patient will be dc home & is agreeable with above plan.  Final Clinical Impressions(s) / ED Diagnoses   Final diagnoses:  Left wrist pain    ED Discharge Orders    None       Leretha DykesHernandez, Aleighna Wojtas P, [place] JerseyPA-C 11/04/18 1709    Tegeler, Canary Brimhristopher J, MD 11/04/18 1946

## 2018-11-04 NOTE — Discharge Instructions (Addendum)
1. Medications: alternate ibuprofen and tylenol for pain control, usual home medications 2. Treatment: rest, ice, elevate and use brace, drink plenty of fluids, gentle stretching 3. Follow Up: Please followup with orthopedics as directed or your PCP in 1 week if no improvement for discussion of your diagnoses and further evaluation after today's visit; if you do not have a primary care doctor use the resource guide provided to find one; Please return to the ER for worsening symptoms or other concerns  

## 2018-11-04 NOTE — ED Notes (Signed)
Wrist brace applied

## 2018-11-04 NOTE — ED Notes (Signed)
ED Provider at bedside. 

## 2018-11-15 IMAGING — CT CT ABD-PELV W/ CM
2 of 4 series · 16 of 46 positions shown, 18 images · IV contrast (agent unspecified)
Comparison: Ob ultrasound 05/03/2015.

CLINICAL DATA: 26-year-old female with periumbilical abdominal pain
nausea, vomiting and fever for 1 day.

EXAM:
CT ABDOMEN AND PELVIS WITH CONTRAST
TECHNIQUE: Multidetector CT imaging of the abdomen and pelvis was performed
using the standard protocol following bolus administration of
intravenous contrast.
CONTRAST:  100 mL 2sovue-BLL

[Series 3: abd/ pelvis 5.0 i30f 2 · axial · 0.67mm/px · z∈[+771,+1171]mm · 13 of 89 slices shown, 15 images]
[im 5/89  soft-tissue]
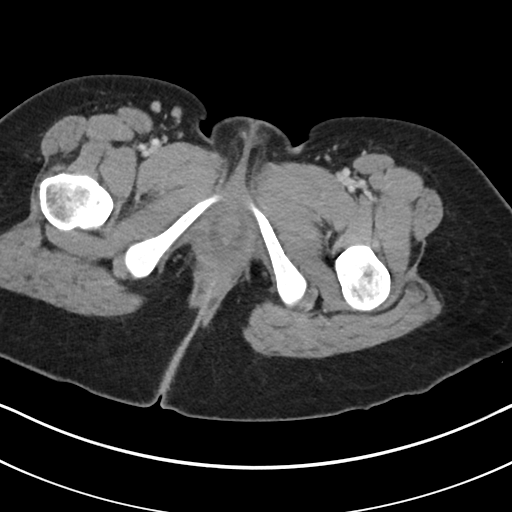
[im 5/89  bone]
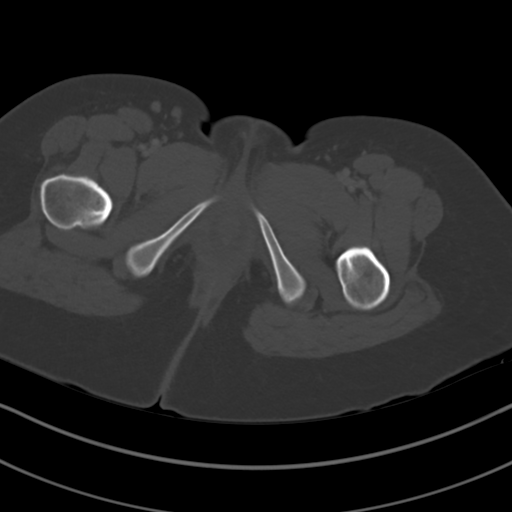
[im 13/89  soft-tissue]
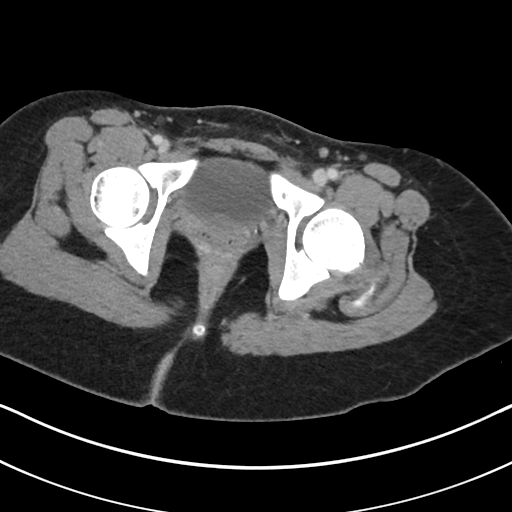
[im 21/89  soft-tissue]
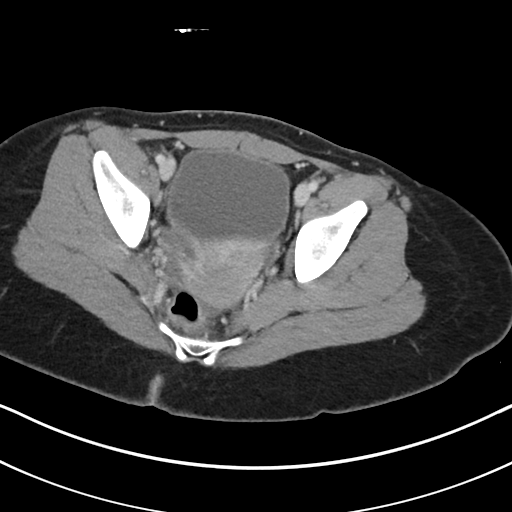
[im 25/89  soft-tissue]
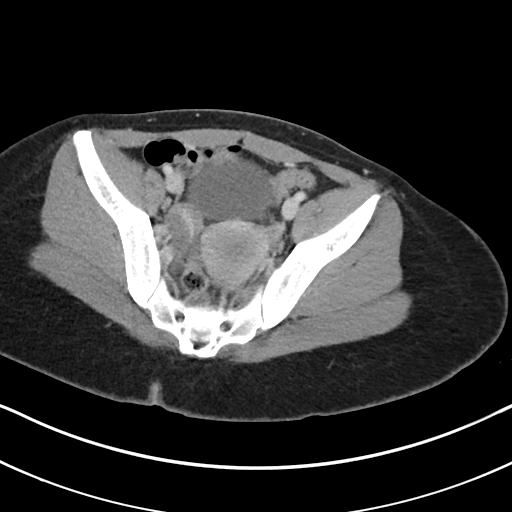
[im 33/89  soft-tissue]
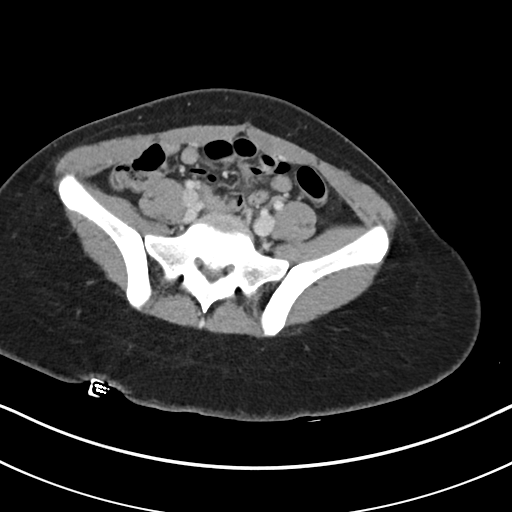
[im 37/89  soft-tissue]
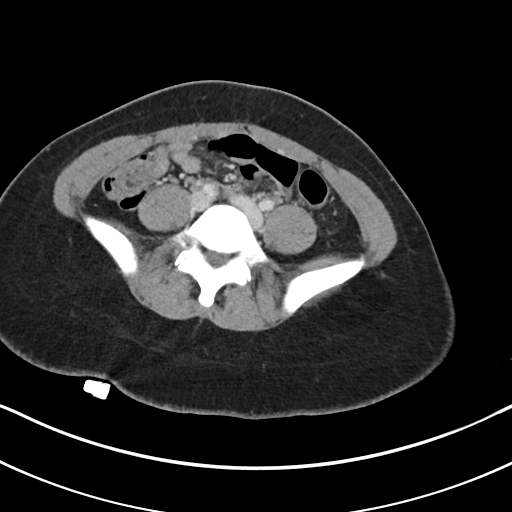
[im 45/89  soft-tissue]
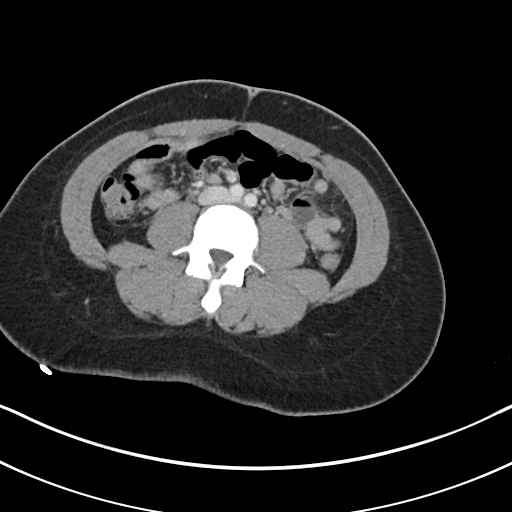
[im 53/89  soft-tissue]
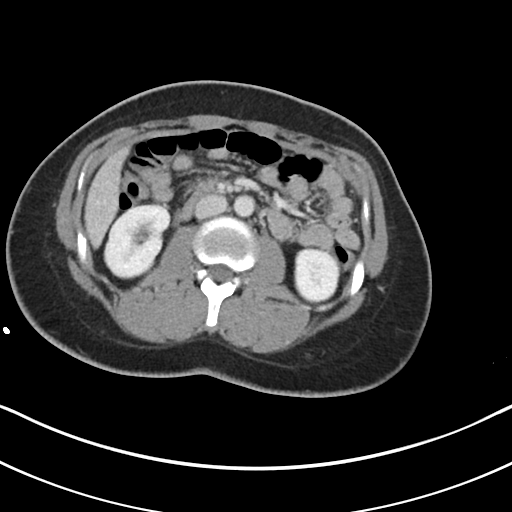
[im 57/89  soft-tissue]
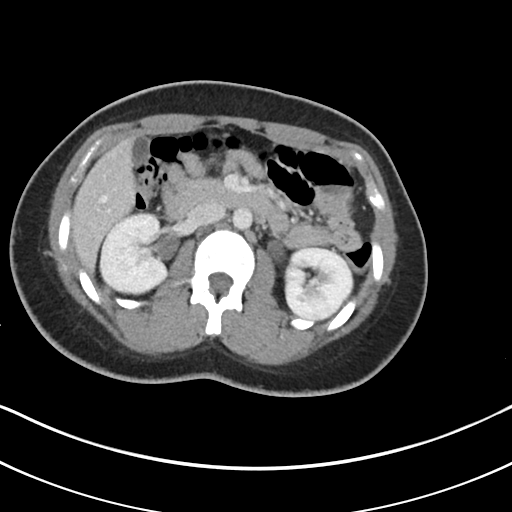
[im 57/89  bone]
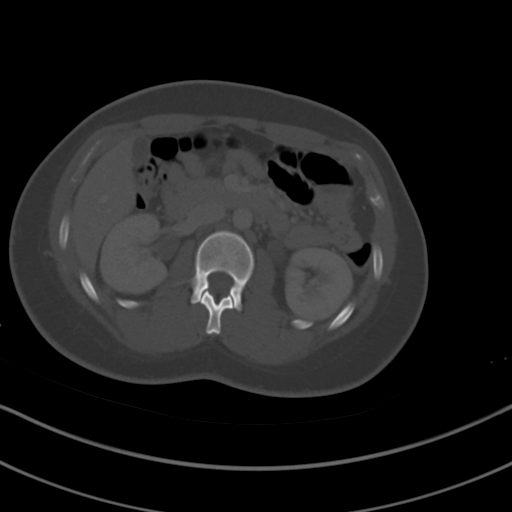
[im 65/89  soft-tissue]
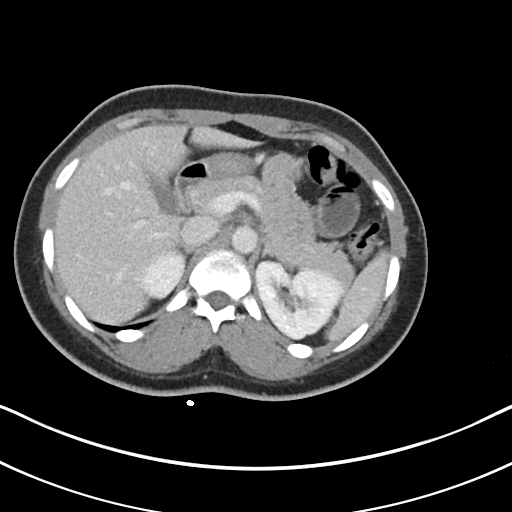
[im 69/89  soft-tissue]
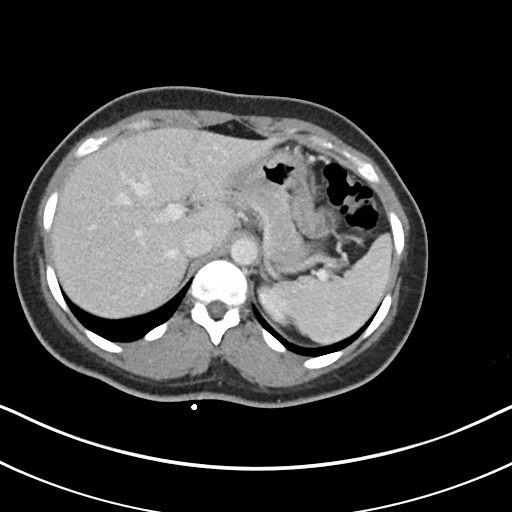
[im 77/89  soft-tissue]
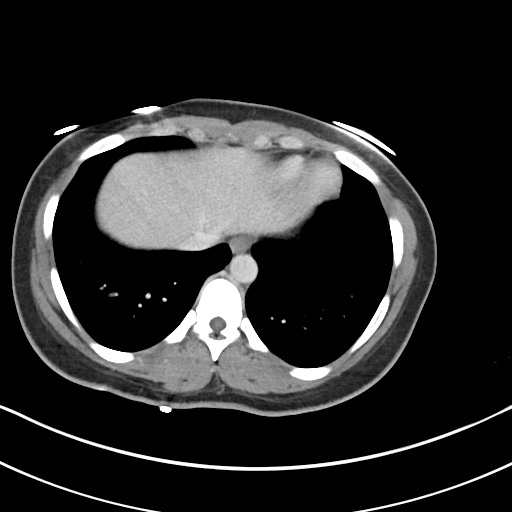
[im 85/89  soft-tissue]
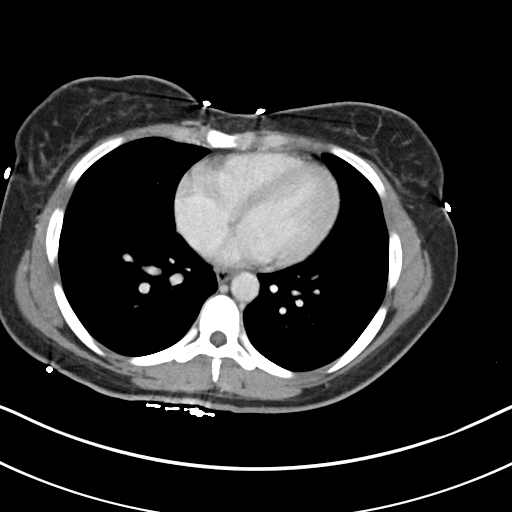

[Series 6: coronal soft tissue · coronal · 0.62mm/px · 3 of 71 slices shown]
[im 24/71  soft-tissue]
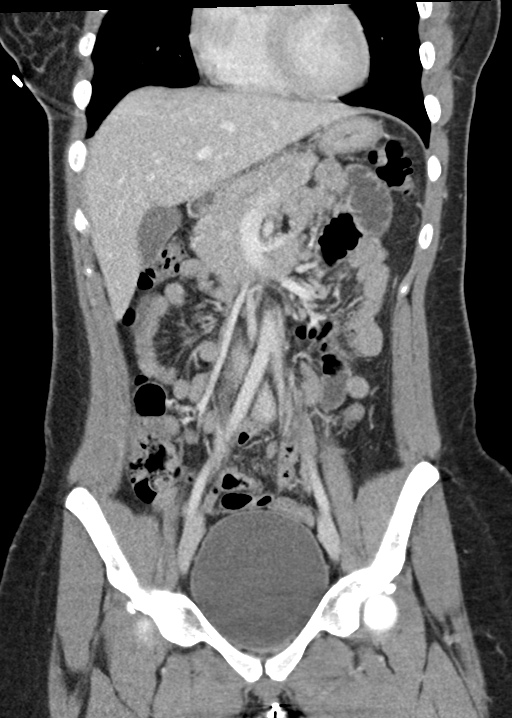
[im 32/71  soft-tissue]
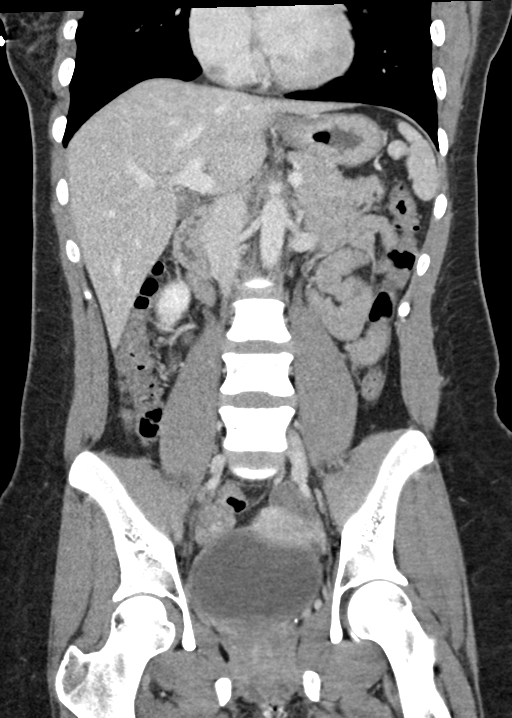
[im 39/71  soft-tissue]
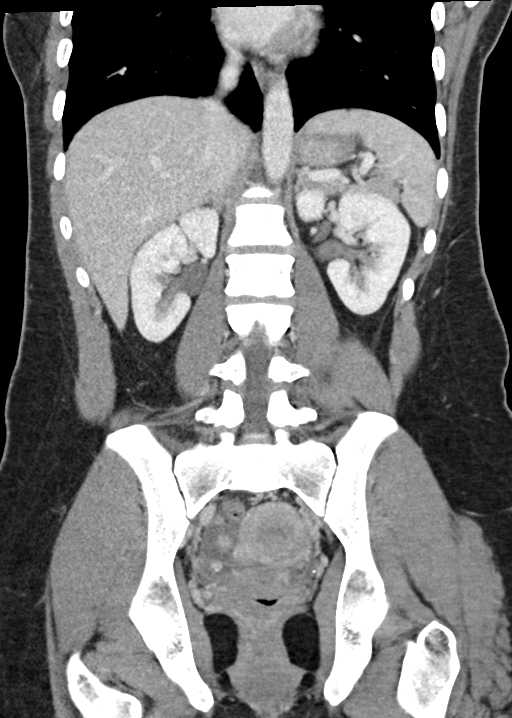

[16 of 46 positions shown; findings below may reference images not displayed]

FINDINGS: Lower chest: Normal lung bases.  No pericardial or pleural effusion.

Hepatobiliary: Negative liver and gallbladder.

Pancreas: Negative.

Spleen: Negative.

Adrenals/Urinary Tract: Normal adrenal glands.

Bilateral renal enhancement is normal. Bilateral extrarenal pelves
suspected (normal variant). No periureteral stranding. No urologic
calculus identified.

Unremarkable urinary bladder

Stomach/Bowel: Negative rectum and sigmoid colon. Left colon and
transverse colon are within normal limits. Right colon is within
normal limits. Normal appendix (coronal image 23). Negative terminal
ileum. No dilated small bowel. Decompressed stomach and duodenum.

No abdominal free fluid or free air.

Vascular/Lymphatic: Major vascular structures including the portal
venous system appear normal. No lymphadenopathy.

Reproductive: Within normal limits for age; probable physiologic
heterogeneity of the right ovary on series 3, image 67.

Other: Incidental genital piercing. Small volume pelvic free fluid
in the cul-de-sac.

Musculoskeletal: Negative.
IMPRESSION: 1. Negative CT abdomen.  Normal appendix.
2. Small volume pelvic free fluid in the cul de sac and
heterogeneity of the right ovary are most likely physiologic.

## 2018-11-28 ENCOUNTER — Encounter (HOSPITAL_COMMUNITY): Payer: Self-pay | Admitting: Emergency Medicine

## 2018-11-28 ENCOUNTER — Emergency Department (HOSPITAL_COMMUNITY)
Admission: EM | Admit: 2018-11-28 | Discharge: 2018-11-29 | Disposition: A | Discharge status: Home / Self Care | Payer: 59 | Attending: Emergency Medicine | Admitting: Emergency Medicine

## 2018-11-28 ENCOUNTER — Other Ambulatory Visit: Payer: Self-pay

## 2018-11-28 DIAGNOSIS — R51 Headache: Secondary | ICD-10-CM | POA: Diagnosis not present

## 2018-11-28 DIAGNOSIS — R519 Headache, unspecified: Secondary | ICD-10-CM

## 2018-11-28 DIAGNOSIS — I10 Essential (primary) hypertension: Secondary | ICD-10-CM | POA: Diagnosis present

## 2018-11-28 HISTORY — DX: Headache: R51

## 2018-11-28 HISTORY — DX: Headache, unspecified: R51.9

## 2018-11-28 NOTE — ED Triage Notes (Signed)
C/o R sided headache and high blood pressure x 3 hours.  States she had a baby in October and OB-GYN told her to stop taking BP medication and she has not been back to PCP since then.  Currently not taking BP medication.

## 2018-11-29 ENCOUNTER — Emergency Department (HOSPITAL_COMMUNITY): Payer: 59

## 2018-11-29 LAB — CBC WITH DIFFERENTIAL/PLATELET
Abs Immature Granulocytes: 0.02 10*3/uL (ref 0.00–0.07)
Basophils Absolute: 0 10*3/uL (ref 0.0–0.1)
Basophils Relative: 0 %
EOS ABS: 0.1 10*3/uL (ref 0.0–0.5)
EOS PCT: 2 %
HCT: 37.1 % (ref 36.0–46.0)
Hemoglobin: 11.3 g/dL — ABNORMAL LOW (ref 12.0–15.0)
Immature Granulocytes: 0 %
Lymphocytes Relative: 32 %
Lymphs Abs: 2.1 10*3/uL (ref 0.7–4.0)
MCH: 26.4 pg (ref 26.0–34.0)
MCHC: 30.5 g/dL (ref 30.0–36.0)
MCV: 86.7 fL (ref 80.0–100.0)
MONO ABS: 0.5 10*3/uL (ref 0.1–1.0)
Monocytes Relative: 8 %
NRBC: 0 % (ref 0.0–0.2)
Neutro Abs: 3.8 10*3/uL (ref 1.7–7.7)
Neutrophils Relative %: 58 %
PLATELETS: 216 10*3/uL (ref 150–400)
RBC: 4.28 MIL/uL (ref 3.87–5.11)
RDW: 12.1 % (ref 11.5–15.5)
WBC: 6.6 10*3/uL (ref 4.0–10.5)

## 2018-11-29 LAB — I-STAT BETA HCG BLOOD, ED (MC, WL, AP ONLY): I-stat hCG, quantitative: <5 m[IU]/mL (ref <5)

## 2018-11-29 LAB — BASIC METABOLIC PANEL
Anion gap: 10 (ref 5–15)
CHLORIDE: 107 mmol/L (ref 98–111)
CO2: 19 mmol/L — ABNORMAL LOW (ref 22–32)
Calcium: 8.9 mg/dL (ref 8.9–10.3)
Creatinine, Ser: 0.67 mg/dL (ref 0.44–1.00)
GFR calc Af Amer: >60 mL/min (ref >60)
Glucose, Bld: 90 mg/dL (ref 70–99)
Potassium: 3.2 mmol/L — ABNORMAL LOW (ref 3.5–5.1)
SODIUM: 136 mmol/L (ref 135–145)

## 2018-11-29 MED ORDER — SODIUM CHLORIDE 0.9 % IV BOLUS (SEPSIS)
1000.0000 mL | Freq: Once | INTRAVENOUS | Status: AC
  Administered 2018-11-29: 1000 mL via INTRAVENOUS

## 2018-11-29 MED ORDER — METOCLOPRAMIDE HCL 5 MG/ML IJ SOLN
10.0000 mg | Freq: Once | INTRAMUSCULAR | Status: AC
  Administered 2018-11-29: 10 mg via INTRAVENOUS
  Filled 2018-11-29: qty 2

## 2018-11-29 MED ORDER — DIPHENHYDRAMINE HCL 50 MG/ML IJ SOLN
25.0000 mg | Freq: Once | INTRAMUSCULAR | Status: AC
  Administered 2018-11-29: 25 mg via INTRAVENOUS
  Filled 2018-11-29: qty 1

## 2018-11-29 NOTE — ED Notes (Signed)
Pt with chronic HTN with Juvenal onset.  Today, pt c/o HTN, Headache and some blurry vision.

## 2018-11-29 NOTE — ED Provider Notes (Signed)
[facility] EMERGENCY DEPARTMENT Provider Note   CSN: [id number] Arrival date & time: 11/28/18  2222    History   Chief Complaint Chief Complaint  Patient presents with  . Headache  . Hypertension    HPI Krista Rodgers is a 28 y.o. female.      28 y.o female with a PMH of HTN (currently not on medication) presents to the ED with a chief complaint of HTN and headache x 5 pm this evening. Patient was at home when she developed a sudden right sided sharp pain behind her right eye which radiated to her neck. She reports taking BC powder for relieving symptoms but states it helped some. She also endorses one episode of vomiting this evening. She reports no alleviating symptoms but endorses some photophobia. She also reports some blurry vision. She denies any fever, neck rigidity, chest pain or shortness of breath.      Past Medical History:  Diagnosis Date  . Headache   . Hyperlipidemia   . Hypertension     Patient Active Problem List   Diagnosis Date Noted  . Acute blood loss anemia 07/26/2018  . Postpartum care following cesarean delivery (10/18) 07/24/2018  . Chorioamnionitis 07/24/2018  . Non-reactive NST (non-stress test) 07/22/2018  . Gestational hypertension w/o significant proteinuria in 3rd trimester 07/22/2018  . Abdominal pain during pregnancy in second trimester 05/05/2018  . Essential hypertension 11/29/2014    Past Surgical History:  Procedure Laterality Date  . CESAREAN SECTION N/A 07/23/2018   Procedure: CESAREAN SECTION;  Surgeon: Maxie Better, MD;  Location: [facility] BIRTHING SUITES;  Service: Obstetrics;  Laterality: N/A;  . NO PAST SURGERIES       OB History    Gravida  3   Para      Term      Preterm      AB  2   Living  0     SAB  2   TAB      Ectopic      Multiple      Live Births               Home Medications    Prior to Admission medications   Medication Sig Start Date End Date Taking?  Authorizing Provider  ferrous sulfate 325 (65 FE) MG tablet Take 1 tablet (325 mg total) by mouth daily with breakfast. Patient not taking: Reported on 11/29/2018 07/28/18 07/28/19  Karena Addison, CNM  hydrALAZINE (APRESOLINE) 50 MG tablet Take 1 tablet (50 mg total) by mouth every 8 (eight) hours. Patient not taking: Reported on 11/29/2018 07/28/18   Karena Addison, CNM  labetalol (NORMODYNE) 200 MG tablet Take 1 tablet (200 mg total) by mouth every 8 (eight) hours. Patient not taking: Reported on 11/29/2018 07/28/18   Karena Addison, CNM  oxyCODONE-acetaminophen (PERCOCET/ROXICET) 5-325 MG tablet Take 1 tablet by mouth every 4 (four) hours as needed (pain scale 4-7). Patient not taking: Reported on 11/29/2018 07/28/18   Karena Addison, CNM  Prenatal Vit-Fe Fumarate-FA (PRENATAL VITAMIN PLUS LOW IRON) 27-1 MG TABS Take 1 tablet by mouth daily. Patient not taking: Reported on 11/29/2018 12/04/17   Conan Bowens, MD  senna-docusate (SENOKOT-S) 8.6-50 MG tablet Take 2 tablets by mouth daily. Patient not taking: Reported on 11/29/2018 07/29/18   Karena Addison, CNM    Family History No family history on file.  Social History Social History   Tobacco Use  . Smoking status: Never Smoker  .  Smokeless tobacco: Never Used  Substance Use Topics  . Alcohol use: No  . Drug use: No     Allergies   Toradol [ketorolac tromethamine]; Amoxicillin; and Peach [prunus persica]   Review of Systems Review of Systems  Constitutional: Negative for chills and fever.  HENT: Negative for ear pain and sore throat.   Eyes: Positive for photophobia and visual disturbance. Negative for pain.  Respiratory: Negative for cough and shortness of breath.   Cardiovascular: Negative for chest pain and palpitations.  Gastrointestinal: Positive for nausea and vomiting. Negative for abdominal pain.  Genitourinary: Negative for dysuria and hematuria.  Musculoskeletal: Negative for arthralgias and back  pain.  Skin: Negative for color change and rash.  Neurological: Positive for headaches. Negative for seizures and syncope.  All other systems reviewed and are negative.    Physical Exam Updated Vital Signs BP (!) 121/92   Pulse 80   Temp 98.2 F (36.8 C) (Oral)   Resp (!) 23   LMP 11/26/2018   SpO2 100%   Physical Exam Vitals signs and nursing note reviewed.  Constitutional:      General: She is not in acute distress.    Appearance: She is well-developed.  HENT:     Head: Normocephalic and atraumatic.     Mouth/Throat:     Pharynx: No oropharyngeal exudate.  Eyes:     Pupils: Pupils are equal, round, and reactive to light.  Neck:     Musculoskeletal: Normal range of motion.  Cardiovascular:     Rate and Rhythm: Regular rhythm.     Heart sounds: Normal heart sounds.  Pulmonary:     Effort: Pulmonary effort is normal. No respiratory distress.     Breath sounds: Normal breath sounds.  Abdominal:     General: Bowel sounds are normal. There is no distension.     Palpations: Abdomen is soft.     Tenderness: There is no abdominal tenderness.  Musculoskeletal:        General: No tenderness or deformity.     Right lower leg: No edema.     Left lower leg: No edema.  Skin:    General: Skin is warm and dry.  Neurological:     Mental Status: She is alert and oriented to person, place, and time.     Comments: Alert, oriented, thought content appropriate. Speech fluent without evidence of aphasia. Able to follow 2 step commands without difficulty.  Cranial Nerves:  II:  Peripheral visual fields grossly normal, pupils, round, reactive to light III,IV, VI: ptosis not present, extra-ocular motions intact bilaterally  V,VII: smile symmetric, facial light touch sensation equal VIII: hearing grossly normal bilaterally  IX,X: midline uvula rise  XI: bilateral shoulder shrug equal and strong XII: midline tongue extension  Motor:  5/5 in upper and lower left extremity including  strong and equal grip strength and dorsiflexion/plantar flexion 4/5 in upper and lower right  Sensory: light touch normal in all extremities.  Cerebellar: normal finger-to-nose with bilateral upper extremities, pronator drift negative Gait: normal gait and balance       ED Treatments / Results  Labs (all labs ordered are listed, but only abnormal results are displayed) Labs Reviewed  CBC WITH DIFFERENTIAL/PLATELET - Abnormal; Notable for the following components:      Result Value   Hemoglobin 11.3 (*)    All other components within normal limits  BASIC METABOLIC PANEL - Abnormal; Notable for the following components:   Potassium 3.2 (*)    CO2  19 (*)    BUN <5 (*)    All other components within normal limits  URINALYSIS, ROUTINE W REFLEX MICROSCOPIC  I-STAT BETA HCG BLOOD, ED ([facility], [facility], [facility] ONLY)    EKG EKG Interpretation  Date/Time:  Monday November 29 2018 01:59:06 EST Ventricular Rate:  72 PR Interval:    QRS Duration: 72 QT Interval:  402 QTC Calculation: 440 R Axis:   62 Text Interpretation:  Sinus rhythm No old tracing to compare Confirmed by Ward, Baxter Hire [phone]) on 11/29/2018 2:05:25 AM   Radiology Ct Head Wo Contrast  Result Date: 11/29/2018 CLINICAL DATA:  28 y/o  F; headache and blurry vision. EXAM: CT HEAD WITHOUT CONTRAST TECHNIQUE: Contiguous axial images were obtained from the base of the skull through the vertex without intravenous contrast. COMPARISON:  10/27/2017 CT head. FINDINGS: Brain: No evidence of acute infarction, hemorrhage, hydrocephalus, extra-axial collection or mass lesion/mass effect. Vascular: No hyperdense vessel or unexpected calcification. Skull: Normal. Negative for fracture or focal lesion. Sinuses/Orbits: No acute finding. Other: None. IMPRESSION: No acute intracranial abnormality identified. Stable unremarkable CT of the head. Electronically Signed   By: Mitzi Hansen M.D.   On: 11/29/2018 01:56    Procedures Procedures  (including critical care time)  Medications Ordered in ED Medications  metoCLOPramide (REGLAN) injection 10 mg (10 mg Intravenous Given 11/29/18 0208)  diphenhydrAMINE (BENADRYL) injection 25 mg (25 mg Intravenous Given 11/29/18 0209)  sodium chloride 0.9 % bolus 1,000 mL (1,000 mLs Intravenous New Bag/Given 11/29/18 0208)     Initial Impression / Assessment and Plan / ED Course  I have reviewed the triage vital signs and the nursing notes.  Pertinent labs & imaging results that were available during my care of the patient were reviewed by me and considered in my medical decision making (see chart for details).      Patient with a previous history of HTN non compliant with medication reports OBGYN took her off her BP meds during pregnancy and she did not follow up after delivery with PCP for BP management. Patient states after her headache she had one episode of emesis. She reports some blurry vision but no decrease in vision. She was ambulatory on arrival, headache did not improve with BC powders. During evaluation patient has a questionable neuro exam with her left upper and lower extremity being weaker. I have discussed this with Dr. Elesa Massed, who has also seen patient suspicion for migraine vs. Acute process. Will obtain CT head along with screen labs. Patient provided with HA cocktail.  CBC showed no leukocytosis, no neck rigidity on exam low suspicion for any meningitis negative kerning sign.  Hemoglobin is within normal limits per patient's baseline.  BMP shows slight decrease in potassium, no other electrolyte abnormality.  Beta was negative.  CT head showed:  No acute intracranial abnormality identified. Stable unremarkable CT  of the head.   Patient's blood pressure has seemed to calm down it is now 121/92, she is comfortably resting and reports headache has improved with headache cocktail.  Suspicion that patient likely suffering from migraine, neuro exam redone prior to discharge, seems  unremarkable.  Patient reports she will need PCP follow-up, I will provide her with the number to the [facility] health and wellness clinic to further manage her blood pressure.  No chest pain, shortness of breath, weakness or lightheadedness.  Return precautions discussed with patient and boyfriend at length.  Final Clinical Impressions(s) / ED Diagnoses   Final diagnoses:  Bad headache  ED Discharge Orders    None       Claude Manges, PA-C 11/29/18 0258    Ward, Layla Maw, DO 11/29/18 [phone]

## 2018-11-29 NOTE — Discharge Instructions (Signed)
I have provided the number to the [facility] health and wellness clinic, please schedule an appointment for further management of your blood pressure as you may need to resume taking medication to control your pressure.  If you experience any chest pain, shortness of breath or worsening symptoms you may return to the ED.

## 2019-11-22 ENCOUNTER — Emergency Department: Admit: 2019-11-22 | Payer: PRIVATE HEALTH INSURANCE

## 2019-11-22 ENCOUNTER — Inpatient Hospital Stay
Admit: 2019-11-22 | Discharge: 2019-11-22 | Disposition: A | Discharge status: Home / Self Care | Payer: PRIVATE HEALTH INSURANCE | Attending: Emergency Medicine

## 2019-11-22 DIAGNOSIS — Z3201 Encounter for pregnancy test, result positive: Secondary | ICD-10-CM

## 2019-11-22 LAB — CBC WITH AUTO DIFFERENTIAL
Basophils %: 0 % (ref 0–2)
Basophils Absolute: 0 10*3/uL (ref 0.0–0.1)
Eosinophils %: 2 % (ref 0–5)
Eosinophils Absolute: 0.1 10*3/uL (ref 0.0–0.4)
Hematocrit: 39.8 % (ref 35.0–45.0)
Hemoglobin: 12.6 g/dL (ref 12.0–16.0)
Lymphocytes %: 25 % (ref 21–52)
Lymphocytes Absolute: 1.5 10*3/uL (ref 0.9–3.6)
MCH: 26.6 PG (ref 24.0–34.0)
MCHC: 31.7 g/dL (ref 31.0–37.0)
MCV: 84.1 FL (ref 74.0–97.0)
MPV: 10.8 FL (ref 9.2–11.8)
Monocytes %: 7 % (ref 3–10)
Monocytes Absolute: 0.4 10*3/uL (ref 0.05–1.2)
Neutrophils %: 66 % (ref 40–73)
Neutrophils Absolute: 4 10*3/uL (ref 1.8–8.0)
Platelets: 257 10*3/uL (ref 135–420)
RBC: 4.73 M/uL (ref 4.20–5.30)
RDW: 13.1 % (ref 11.6–14.5)
WBC: 6.1 10*3/uL (ref 4.6–13.2)

## 2019-11-22 LAB — BASIC METABOLIC PANEL
Anion Gap: 6 mmol/L (ref 3.0–18)
BUN: 8 MG/DL (ref 7.0–18)
Bun/Cre Ratio: 11 — ABNORMAL LOW (ref 12–20)
CO2: 26 mmol/L (ref 21–32)
Calcium: 8.4 MG/DL — ABNORMAL LOW (ref 8.5–10.1)
Chloride: 107 mmol/L (ref 100–111)
Creatinine: 0.7 MG/DL (ref 0.6–1.3)
EGFR IF NonAfrican American: >60 mL/min/{1.73_m2} (ref >60)
GFR African American: >60 mL/min/{1.73_m2} (ref >60)
Glucose: 87 mg/dL (ref 74–99)
Potassium: 3.7 mmol/L (ref 3.5–5.5)
Sodium: 139 mmol/L (ref 136–145)

## 2019-11-22 LAB — URINALYSIS W/ RFLX MICROSCOPIC
Bilirubin, Urine: NEGATIVE
Bilirubin: NEGATIVE
Blood, Urine: NEGATIVE
Blood: NEGATIVE
Glucose, Ur: NEGATIVE mg/dL
Glucose: NEGATIVE mg/dL
Ketone: NEGATIVE mg/dL
Ketones, Urine: NEGATIVE mg/dL
Leukocyte Esterase, Urine: NEGATIVE
Leukocyte Esterase: NEGATIVE
Nitrite, Urine: NEGATIVE
Nitrites: NEGATIVE
Protein, UA: NEGATIVE mg/dL
Protein: NEGATIVE mg/dL
Specific Gravity, UA: 1.015 (ref 1.005–1.030)
Specific gravity: 1.015 (ref 1.005–1.030)
Urobilinogen, UA, POCT: 0.2 EU/dL (ref 0.2–1.0)
Urobilinogen: 0.2 EU/dL (ref 0.2–1.0)
pH (UA): 6.5 (ref 5.0–8.0)
pH, UA: 6.5 (ref 5.0–8.0)

## 2019-11-22 LAB — HCG URINE, QL
HCG urine, QL: POSITIVE — AB
Pregnancy Test(Urn): POSITIVE — AB

## 2019-11-22 LAB — MAGNESIUM
Magnesium: 2.4 mg/dL (ref 1.6–2.6)
Magnesium: 2.4 mg/dL (ref 1.6–2.6)

## 2019-11-22 LAB — BETA HCG, QT
Beta HCG, QT: 4319 m[IU]/mL — ABNORMAL HIGH (ref 0–10)
hCG Quant: 4319 m[IU]/mL — ABNORMAL HIGH (ref 0–10)

## 2019-11-22 LAB — CBC WITH AUTOMATED DIFF
ABS. BASOPHILS: 0 10*3/uL (ref 0.0–0.1)
ABS. EOSINOPHILS: 0.1 10*3/uL (ref 0.0–0.4)
ABS. LYMPHOCYTES: 1.5 10*3/uL (ref 0.9–3.6)
ABS. MONOCYTES: 0.4 10*3/uL (ref 0.05–1.2)
ABS. NEUTROPHILS: 4 10*3/uL (ref 1.8–8.0)
BASOPHILS: 0 % (ref 0–2)
EOSINOPHILS: 2 % (ref 0–5)
HCT: 39.8 % (ref 35.0–45.0)
HGB: 12.6 g/dL (ref 12.0–16.0)
LYMPHOCYTES: 25 % (ref 21–52)
MCH: 26.6 PG (ref 24.0–34.0)
MCHC: 31.7 g/dL (ref 31.0–37.0)
MCV: 84.1 FL (ref 74.0–97.0)
MONOCYTES: 7 % (ref 3–10)
MPV: 10.8 FL (ref 9.2–11.8)
NEUTROPHILS: 66 % (ref 40–73)
PLATELET: 257 10*3/uL (ref 135–420)
RBC: 4.73 M/uL (ref 4.20–5.30)
RDW: 13.1 % (ref 11.6–14.5)
WBC: 6.1 10*3/uL (ref 4.6–13.2)

## 2019-11-22 LAB — METABOLIC PANEL, BASIC
Anion gap: 6 mmol/L (ref 3.0–18)
BUN/Creatinine ratio: 11 — ABNORMAL LOW (ref 12–20)
BUN: 8 MG/DL (ref 7.0–18)
CO2: 26 mmol/L (ref 21–32)
Calcium: 8.4 MG/DL — ABNORMAL LOW (ref 8.5–10.1)
Chloride: 107 mmol/L (ref 100–111)
Creatinine: 0.7 MG/DL (ref 0.6–1.3)
GFR est AA: >60 mL/min/{1.73_m2} (ref >60)
GFR est non-AA: >60 mL/min/{1.73_m2} (ref >60)
Glucose: 87 mg/dL (ref 74–99)
Potassium: 3.7 mmol/L (ref 3.5–5.5)
Sodium: 139 mmol/L (ref 136–145)

## 2019-11-22 NOTE — ED Notes (Signed)
Pt aware that ultrasound tech in department and that she is the 2nd study to be done. Pt verbalized understanding and denies any needs from RN at this time.

## 2019-11-22 NOTE — ED Notes (Signed)
Pt presented for right sided abdominal pain since last night. History of same symptoms 2 years ago.  Pt presents in NAD.

## 2019-11-22 NOTE — ED Provider Notes (Signed)
HPI patient complains of dull right-sided lower abdominal and pelvic pain that started yesterday evening.  She describes as a dull aching sensation is nonradiating.  She denies any nausea or vomiting or changes in bowel or bladder habits.  She denies any vaginal discharge or bleeding.  She says her last menstrual cycle was about 6 weeks ago and was normal.  She says she had similar symptoms several years ago and was told it was a urinary tract infection and this prompted her to come the emergency room today for evaluation.  No further specifics given at this time.    Past Medical History:   Diagnosis Date   ??? Hypertension        History reviewed. No pertinent surgical history.      Family History:   Problem Relation Age of Onset   ??? No Known Problems Mother    ??? Hypertension Father    ??? High Cholesterol Father    ??? Stroke Maternal Grandmother 25   ??? Diabetes Maternal Grandmother    ??? Hypertension Maternal Grandmother    ??? Kidney Disease Maternal Grandmother    ??? Diabetes Paternal Grandmother    ??? Hypertension Paternal Grandmother    ??? Hypertension Maternal Grandfather    ??? Hypertension Paternal Grandfather        Social History     Socioeconomic History   ??? Marital status: SINGLE     Spouse name: Not on file   ??? Number of children: Not on file   ??? Years of education: Not on file   ??? Highest education level: Not on file   Occupational History   ??? Not on file   Social Needs   ??? Financial resource strain: Not on file   ??? Food insecurity     Worry: Not on file     Inability: Not on file   ??? Transportation needs     Medical: Not on file     Non-medical: Not on file   Tobacco Use   ??? Smoking status: Never Smoker   ??? Smokeless tobacco: Never Used   Substance and Sexual Activity   ??? Alcohol use: Yes     Alcohol/week: 0.0 standard drinks     Comment: occasionally   ??? Drug use: No   ??? Sexual activity: Yes     Partners: Male     Birth control/protection: Injection   Lifestyle   ??? Physical activity      Days per week: Not on file     Minutes per session: Not on file   ??? Stress: Not on file   Relationships   ??? Social Wellsite geologist on phone: Not on file     Gets together: Not on file     Attends religious service: Not on file     Active member of club or organization: Not on file     Attends meetings of clubs or organizations: Not on file     Relationship status: Not on file   ??? Intimate partner violence     Fear of current or ex partner: Not on file     Emotionally abused: Not on file     Physically abused: Not on file     Forced sexual activity: Not on file   Other Topics Concern   ??? Not on file   Social History Narrative   ??? Not on file         ALLERGIES: Amoxicillin  Review of Systems   Constitutional: Negative.    HENT: Negative.    Respiratory: Negative.    Cardiovascular: Negative.    Gastrointestinal: Negative.    Genitourinary: Positive for pelvic pain.   Musculoskeletal: Negative.    Skin: Negative.    Neurological: Negative.    Psychiatric/Behavioral: Negative.        Vitals:    11/22/19 0855   BP: (!) 150/102   Pulse: 84   Resp: 17   Temp: 98.9 ??F (37.2 ??C)   SpO2: 100%   Weight: 65.8 kg (145 lb)   Height: 5' (1.524 m)            Physical Exam  Vitals signs and nursing note reviewed.   Constitutional:       Appearance: She is well-developed.   HENT:      Head: Normocephalic and atraumatic.      Nose: Nose normal.   Eyes:      Conjunctiva/sclera: Conjunctivae normal.      Pupils: Pupils are equal, round, and reactive to light.   Neck:      Musculoskeletal: Normal range of motion and neck supple.   Cardiovascular:      Rate and Rhythm: Normal rate and regular rhythm.   Pulmonary:      Effort: Pulmonary effort is normal.      Breath sounds: Normal breath sounds.   Abdominal:      Palpations: Abdomen is soft.   Musculoskeletal: Normal range of motion.   Skin:     General: Skin is warm and dry.   Neurological:      Mental Status: She is alert and oriented to person, place, and time.    Psychiatric:         Mood and Affect: Mood normal.          MDM  Number of Diagnoses or Management Options  Pelvic pain during pregnancy  Pregnancy test performed, pregnancy confirmed  Diagnosis management comments: I reviewed the results of the ER evaluation with the patient.  She was previously unaware of her pregnancy status.  She understands and agrees with the disposition and follow-up plan.  Georgiann Hahn, MD 12:45 PM         Procedures

## 2019-11-22 NOTE — ED Triage Notes (Signed)
Pt presented for right sided abdominal pain since last night. History of same symptoms 2 years ago.  Pt presents in NAD.

## 2019-11-22 NOTE — ED Notes (Signed)
Pt aware that ultrasound tech in department and that she is the 2nd study to be done. Pt verbalized understanding and denies any needs from RN at this time.

## 2019-11-22 NOTE — ED Provider Notes (Signed)
HPI patient complains of dull right-sided lower abdominal and pelvic pain that started yesterday evening.  She describes as a dull aching sensation is nonradiating.  She denies any nausea or vomiting or changes in bowel or bladder habits.  She denies any vaginal discharge or bleeding.  She says her last menstrual cycle was about 6 weeks ago and was normal.  She says she had similar symptoms several years ago and was told it was a urinary tract infection and this prompted her to come the emergency room today for evaluation.  No further specifics given at this time.    Past Medical History:   Diagnosis Date   ??? Hypertension        History reviewed. No pertinent surgical history.      Family History:   Problem Relation Age of Onset   ??? No Known Problems Mother    ??? Hypertension Father    ??? High Cholesterol Father    ??? Stroke Maternal Grandmother 41   ??? Diabetes Maternal Grandmother    ??? Hypertension Maternal Grandmother    ??? Kidney Disease Maternal Grandmother    ??? Diabetes Paternal Grandmother    ??? Hypertension Paternal Grandmother    ??? Hypertension Maternal Grandfather    ??? Hypertension Paternal Grandfather        Social History     Socioeconomic History   ??? Marital status: SINGLE     Spouse name: Not on file   ??? Number of children: Not on file   ??? Years of education: Not on file   ??? Highest education level: Not on file   Occupational History   ??? Not on file   Social Needs   ??? Financial resource strain: Not on file   ??? Food insecurity     Worry: Not on file     Inability: Not on file   ??? Transportation needs     Medical: Not on file     Non-medical: Not on file   Tobacco Use   ??? Smoking status: Never Smoker   ??? Smokeless tobacco: Never Used   Substance and Sexual Activity   ??? Alcohol use: Yes     Alcohol/week: 0.0 standard drinks     Comment: occasionally   ??? Drug use: No   ??? Sexual activity: Yes     Partners: Male     Birth control/protection: Injection   Lifestyle   ??? Physical activity     Days per week: Not on  file     Minutes per session: Not on file   ??? Stress: Not on file   Relationships   ??? Social Product manager on phone: Not on file     Gets together: Not on file     Attends religious service: Not on file     Active member of club or organization: Not on file     Attends meetings of clubs or organizations: Not on file     Relationship status: Not on file   ??? Intimate partner violence     Fear of current or ex partner: Not on file     Emotionally abused: Not on file     Physically abused: Not on file     Forced sexual activity: Not on file   Other Topics Concern   ??? Not on file   Social History Narrative   ??? Not on file         ALLERGIES: Amoxicillin  Review of Systems   Constitutional: Negative.    HENT: Negative.    Respiratory: Negative.    Cardiovascular: Negative.    Gastrointestinal: Negative.    Genitourinary: Positive for pelvic pain.   Musculoskeletal: Negative.    Skin: Negative.    Neurological: Negative.    Psychiatric/Behavioral: Negative.        Vitals:    11/22/19 0855   BP: (!) 150/102   Pulse: 84   Resp: 17   Temp: 98.9 ??F (37.2 ??C)   SpO2: 100%   Weight: 65.8 kg (145 lb)   Height: 5' (1.524 m)            Physical Exam  Vitals signs and nursing note reviewed.   Constitutional:       Appearance: She is well-developed.   HENT:      Head: Normocephalic and atraumatic.      Nose: Nose normal.   Eyes:      Conjunctiva/sclera: Conjunctivae normal.      Pupils: Pupils are equal, round, and reactive to light.   Neck:      Musculoskeletal: Normal range of motion and neck supple.   Cardiovascular:      Rate and Rhythm: Normal rate and regular rhythm.   Pulmonary:      Effort: Pulmonary effort is normal.      Breath sounds: Normal breath sounds.   Abdominal:      Palpations: Abdomen is soft.   Musculoskeletal: Normal range of motion.   Skin:     General: Skin is warm and dry.   Neurological:      Mental Status: She is alert and oriented to person, place, and time.   Psychiatric:         Mood and Affect:  Mood normal.          MDM  Number of Diagnoses or Management Options  Pelvic pain during pregnancy  Pregnancy test performed, pregnancy confirmed  Diagnosis management comments: I reviewed the results of the ER evaluation with the patient.  She was previously unaware of her pregnancy status.  She understands and agrees with the disposition and follow-up plan.  Blanche East, MD 12:45 PM         Procedures

## 2020-02-13 ENCOUNTER — Inpatient Hospital Stay
Admit: 2020-02-13 | Discharge: 2020-02-13 | Disposition: A | Discharge status: Home / Self Care | Payer: PRIVATE HEALTH INSURANCE | Attending: Emergency Medicine

## 2020-02-13 DIAGNOSIS — R519 Headache, unspecified: Secondary | ICD-10-CM

## 2020-02-13 LAB — CBC WITH AUTO DIFFERENTIAL
Basophils %: 1 % (ref 0–2)
Basophils Absolute: 0 10*3/uL (ref 0.0–0.1)
Eosinophils %: 2 % (ref 0–5)
Eosinophils Absolute: 0.1 10*3/uL (ref 0.0–0.4)
Hematocrit: 37.6 % (ref 35.0–45.0)
Hemoglobin: 12.3 g/dL (ref 12.0–16.0)
Lymphocytes %: 28 % (ref 21–52)
Lymphocytes Absolute: 1.6 10*3/uL (ref 0.9–3.6)
MCH: 26.7 PG (ref 24.0–34.0)
MCHC: 32.7 g/dL (ref 31.0–37.0)
MCV: 81.6 FL (ref 74.0–97.0)
MPV: 11.1 FL (ref 9.2–11.8)
Monocytes %: 7 % (ref 3–10)
Monocytes Absolute: 0.4 10*3/uL (ref 0.05–1.2)
Neutrophils %: 62 % (ref 40–73)
Neutrophils Absolute: 3.6 10*3/uL (ref 1.8–8.0)
Platelets: 268 10*3/uL (ref 135–420)
RBC: 4.61 M/uL (ref 4.20–5.30)
RDW: 12.3 % (ref 11.6–14.5)
WBC: 5.8 10*3/uL (ref 4.6–13.2)

## 2020-02-13 LAB — BASIC METABOLIC PANEL
Anion Gap: 4 mmol/L (ref 3.0–18)
BUN: 11 MG/DL (ref 7.0–18)
Bun/Cre Ratio: 16 (ref 12–20)
CO2: 27 mmol/L (ref 21–32)
Calcium: 8.5 MG/DL (ref 8.5–10.1)
Chloride: 109 mmol/L (ref 100–111)
Creatinine: 0.7 MG/DL (ref 0.6–1.3)
EGFR IF NonAfrican American: >60 mL/min/{1.73_m2} (ref >60)
GFR African American: >60 mL/min/{1.73_m2} (ref >60)
Glucose: 87 mg/dL (ref 74–99)
Potassium: 3.4 mmol/L — ABNORMAL LOW (ref 3.5–5.5)
Sodium: 140 mmol/L (ref 136–145)

## 2020-02-13 LAB — HCG QL SERUM
HCG, Ql.: NEGATIVE
HCG, Ql.: NEGATIVE

## 2020-02-13 LAB — METABOLIC PANEL, BASIC
Anion gap: 4 mmol/L (ref 3.0–18)
BUN/Creatinine ratio: 16 (ref 12–20)
BUN: 11 MG/DL (ref 7.0–18)
CO2: 27 mmol/L (ref 21–32)
Calcium: 8.5 MG/DL (ref 8.5–10.1)
Chloride: 109 mmol/L (ref 100–111)
Creatinine: 0.7 MG/DL (ref 0.6–1.3)
GFR est AA: >60 mL/min/{1.73_m2} (ref >60)
GFR est non-AA: >60 mL/min/{1.73_m2} (ref >60)
Glucose: 87 mg/dL (ref 74–99)
Potassium: 3.4 mmol/L — ABNORMAL LOW (ref 3.5–5.5)
Sodium: 140 mmol/L (ref 136–145)

## 2020-02-13 LAB — CBC WITH AUTOMATED DIFF
ABS. BASOPHILS: 0 10*3/uL (ref 0.0–0.1)
ABS. EOSINOPHILS: 0.1 10*3/uL (ref 0.0–0.4)
ABS. LYMPHOCYTES: 1.6 10*3/uL (ref 0.9–3.6)
ABS. MONOCYTES: 0.4 10*3/uL (ref 0.05–1.2)
ABS. NEUTROPHILS: 3.6 10*3/uL (ref 1.8–8.0)
BASOPHILS: 1 % (ref 0–2)
EOSINOPHILS: 2 % (ref 0–5)
HCT: 37.6 % (ref 35.0–45.0)
HGB: 12.3 g/dL (ref 12.0–16.0)
LYMPHOCYTES: 28 % (ref 21–52)
MCH: 26.7 PG (ref 24.0–34.0)
MCHC: 32.7 g/dL (ref 31.0–37.0)
MCV: 81.6 FL (ref 74.0–97.0)
MONOCYTES: 7 % (ref 3–10)
MPV: 11.1 FL (ref 9.2–11.8)
NEUTROPHILS: 62 % (ref 40–73)
PLATELET: 268 10*3/uL (ref 135–420)
RBC: 4.61 M/uL (ref 4.20–5.30)
RDW: 12.3 % (ref 11.6–14.5)
WBC: 5.8 10*3/uL (ref 4.6–13.2)

## 2020-02-13 MED ORDER — POTASSIUM CHLORIDE SR 20 MEQ TAB, PARTICLES/CRYSTALS
20 mEq | ORAL_TABLET | Freq: Three times a day (TID) | ORAL | 0 refills | Status: AC
Start: 2020-02-13 — End: 2020-02-16

## 2020-02-13 MED ORDER — METOCLOPRAMIDE 5 MG/ML IJ SOLN
5 mg/mL | INTRAMUSCULAR | Status: AC
Start: 2020-02-13 — End: 2020-02-13
  Administered 2020-02-13: 16:00:00 via INTRAVENOUS

## 2020-02-13 MED ORDER — POTASSIUM CHLORIDE SR 20 MEQ TAB, PARTICLES/CRYSTALS
20 mEq | ORAL | Status: AC
Start: 2020-02-13 — End: 2020-02-13
  Administered 2020-02-13: 17:00:00 via ORAL

## 2020-02-13 MED ORDER — SODIUM CHLORIDE 0.9% BOLUS IV
0.9 % | Freq: Once | INTRAVENOUS | Status: AC
Start: 2020-02-13 — End: 2020-02-13
  Administered 2020-02-13: 16:00:00 via INTRAVENOUS

## 2020-02-13 MED ORDER — AMLODIPINE 5 MG TAB
5 mg | ORAL_TABLET | Freq: Every day | ORAL | 0 refills | Status: AC
Start: 2020-02-13 — End: ?

## 2020-02-13 MED ORDER — DIPHENHYDRAMINE HCL 50 MG/ML IJ SOLN
50 mg/mL | INTRAMUSCULAR | Status: AC
Start: 2020-02-13 — End: 2020-02-13
  Administered 2020-02-13: 16:00:00 via INTRAVENOUS

## 2020-02-13 MED FILL — SODIUM CHLORIDE 0.9 % IV: INTRAVENOUS | Qty: 1000

## 2020-02-13 MED FILL — METOCLOPRAMIDE 5 MG/ML IJ SOLN: 5 mg/mL | INTRAMUSCULAR | Qty: 2

## 2020-02-13 MED FILL — DIPHENHYDRAMINE HCL 50 MG/ML IJ SOLN: 50 mg/mL | INTRAMUSCULAR | Qty: 0.25

## 2020-02-13 MED FILL — KLOR-CON M20 MEQ TABLET,EXTENDED RELEASE: 20 mEq | ORAL | Qty: 2

## 2020-02-13 MED FILL — DIPHENHYDRAMINE HCL 50 MG/ML IJ SOLN: 50 mg/mL | INTRAMUSCULAR | Qty: 1

## 2020-02-13 NOTE — ED Provider Notes (Signed)
ED Provider Notes by Silvio Pate, PA at 02/13/20 1114                Author: Silvio Pate, PA  Service: EMERGENCY  Author Type: Physician Assistant       Filed: 02/13/20 1858  Date of Service: 02/13/20 1114  Status: Attested           Editor: Silvio Pate, PA (Physician Assistant)  Cosigner: Earlie Lou, DO at 02/14/20 1301          Attestation signed by Earlie Lou, DO at 02/14/20 1301          I was personally available for consultation in the emergency department by the mid level provider who evaluated and treated the patient. I have not personally  evaluated the patient.      Earlie Lou, D.O.                                    EMERGENCY DEPARTMENT HISTORY AND PHYSICAL EXAM      11:14 AM         Date: 02/13/2020   Patient Name: Kayla Huff        History of Presenting Illness          Chief Complaint       Patient presents with        ?  Headache        ?  Tingling              History Provided By: Patient      Additional History (Context): Kayla Huff  is a 29 y.o. female with  hx of HTN, DUB, migraines, and other noted PMH who presents with complaint of right frontal headache associated with neck pain and numbness and tingling radiating to her right hand since 4 AM this morning.   Patient notes gradual onset of symptoms.  Patient notes she attempted [facility] powder without relief of symptoms.  Patient notes history of similar headaches in the past, notes 1-2 episodes a week for the past 2 years.  Denies dizziness, changes in vision,  slurred speech, facial droop, extremity weakness, chest pain, shortness of breath, nausea or vomiting, fever or chills, photophobia, head injury, worse headache of life.  Notes she was seen in February for similar headache, had lab work and a CT scan  at that time.  Note she does not have a primary care physician or a neurologist.  LMP 5/7         PCP: None        Current Outpatient Medications          Medication  Sig  Dispense  Refill           ?   aspirin/salicylamide/caffeine (BC HEADACHE POWDER PO)  Take  by mouth.         ?  potassium chloride (K-DUR, KLOR-CON) 20 mEq tablet  Take 1 Tab by mouth three (3) times daily for 3 days.  9 Tab  0           ?  amLODIPine (NORVASC) 5 mg tablet  Take 1 Tab by mouth daily.  30 Tab  0             Past History        Past Medical History:     Past Medical History:        Diagnosis  Date         ?  DUB (dysfunctional uterine bleeding)       ?  Headache           ?  Hypertension             Past Surgical History:   History reviewed. No pertinent surgical history.      Family History:     Family History         Problem  Relation  Age of Onset          ?  No Known Problems  Mother       ?  Hypertension  Father       ?  High Cholesterol  Father       ?  Stroke  Maternal Grandmother  32     ?  Diabetes  Maternal Grandmother       ?  Hypertension  Maternal Grandmother       ?  Kidney Disease  Maternal Grandmother       ?  Diabetes  Paternal Grandmother       ?  Hypertension  Paternal Grandmother       ?  Hypertension  Maternal Grandfather            ?  Hypertension  Paternal Grandfather             Social History:     Social History          Tobacco Use         ?  Smoking status:  Never Smoker     ?  Smokeless tobacco:  Never Used       Substance Use Topics         ?  Alcohol use:  Yes              Alcohol/week:  0.0 standard drinks             Comment: occasionally         ?  Drug use:  No           Allergies:     Allergies        Allergen  Reactions         ?  Amoxicillin  Rash                Review of Systems           Review of Systems    Constitutional: Negative for chills and fever.    Respiratory: Negative for shortness of breath.     Cardiovascular: Negative for chest pain.    Gastrointestinal: Negative for abdominal pain, nausea and vomiting.    Musculoskeletal: Positive for neck pain.    Skin: Negative for rash.    Neurological: Positive for numbness and headaches . Negative for weakness.    All other systems  reviewed and are negative.              Physical Exam        Visit Vitals      BP  (!) 153/102 (BP 1 Location: Left upper arm)     Pulse  73     Temp  98.6 ??F (37 ??C)     Resp  20     Ht  5\' 1"  (1.549 m)     Wt  74.8 kg (165 lb)     LMP  02/09/2020     SpO2  100%  BMI  31.18 kg/m??              Physical Exam   Vitals signs and nursing note reviewed.   Constitutional:        General: She is not in acute distress.     Appearance: Normal appearance. She is well-developed. She is not ill-appearing, toxic-appearing or diaphoretic.   HENT:       Head: Normocephalic and atraumatic.      Mouth/Throat:      Mouth: Mucous membranes are moist.    Eyes:       Pupils: Pupils are equal, round, and reactive to light.   Neck :       Musculoskeletal: Normal range of motion and neck supple.   Cardiovascular :       Rate and Rhythm: Normal rate and regular rhythm.      Heart sounds: Normal heart sounds. No murmur. No friction rub. No gallop.     Pulmonary:       Effort: Pulmonary effort is normal. No respiratory distress.      Breath sounds: Normal breath sounds. No wheezing or rales.    Musculoskeletal: Normal range of motion.    Skin:      General: Skin is warm.      Findings: No rash.   Neurological :       General: No focal deficit present.      Mental Status: She is alert and oriented to person, place, and time.      Cranial Nerves: Cranial nerves are intact.      Sensory: Sensation is intact.      Motor: Motor function is intact.       Coordination: Coordination is intact.      Gait: Gait is intact.                 Diagnostic Study Results        Labs -     Recent Results (from the past 12 hour(s))     CBC WITH AUTOMATED DIFF          Collection Time: 02/13/20 11:42 AM         Result  Value  Ref Range            WBC  5.8  4.6 - 13.2 K/uL       RBC  4.61  4.20 - 5.30 M/uL       HGB  12.3  12.0 - 16.0 g/dL       HCT  40.937.6  81.135.0 - 45.0 %       MCV  81.6  74.0 - 97.0 FL       MCH  26.7  24.0 - 34.0 PG       MCHC  32.7  31.0 -  37.0 g/dL       RDW  91.412.3  78.211.6 - 14.5 %       PLATELET  268  135 - 420 K/uL       MPV  11.1  9.2 - 11.8 FL       NEUTROPHILS  62  40 - 73 %       LYMPHOCYTES  28  21 - 52 %       MONOCYTES  7  3 - 10 %       EOSINOPHILS  2  0 - 5 %       BASOPHILS  1  0 - 2 %  ABS. NEUTROPHILS  3.6  1.8 - 8.0 K/UL       ABS. LYMPHOCYTES  1.6  0.9 - 3.6 K/UL       ABS. MONOCYTES  0.4  0.05 - 1.2 K/UL       ABS. EOSINOPHILS  0.1  0.0 - 0.4 K/UL       ABS. BASOPHILS  0.0  0.0 - 0.1 K/UL       DF  AUTOMATED          METABOLIC PANEL, BASIC          Collection Time: 02/13/20 11:42 AM         Result  Value  Ref Range            Sodium  140  136 - 145 mmol/L       Potassium  3.4 (L)  3.5 - 5.5 mmol/L       Chloride  109  100 - 111 mmol/L       CO2  27  21 - 32 mmol/L       Anion gap  4  3.0 - 18 mmol/L       Glucose  87  74 - 99 mg/dL       BUN  11  7.0 - 18 MG/DL       Creatinine  0.93  0.6 - 1.3 MG/DL       BUN/Creatinine ratio  16  12 - 20         GFR est AA  >60  >60 ml/min/1.34m2       GFR est non-AA  >60  >60 ml/min/1.17m2       Calcium  8.5  8.5 - 10.1 MG/DL       HCG QL SERUM          Collection Time: 02/13/20 11:42 AM         Result  Value  Ref Range            HCG, Ql.  Negative  NEG             Radiologic Studies -      No orders to display                Medical Decision Making     I am the first provider for this patient.      I reviewed the vital signs, available nursing notes, past medical history, past surgical history, family history and social history.      Vital Signs-Reviewed the patient's vital signs.      Records Reviewed: Nursing Notes and Old Medical Records (Time of Review: 11:14 AM)      ED Course: Progress Notes, Reevaluation, and Consults:   12:45 PM: Pt resting comfortably, notes pain has reduced from 8/10 to 3/10. Feels well enough to go home.      Notes she does not take any medication for her blood pressure, will initiate anti-hypertensive in the ED.        Reviewed results with patient. Discussed need  for close outpatient follow-up this week for reassessment. Discussed strict return precautions, including dizziness, changes in vision, or any other medical concerns.      One or more blood pressure readings were noted elevated during the patient's presentation in the emergency department today.  This abnormal reading has been cited in the patients' diagnosis, and they have been encouraged to follow up with their primary  care physician, or referred to a consultant  for further evaluation and treatment.      Provider Notes (Medical Decision Making): 29 year old female with history of hypertension, headaches who presents to the ED due to right frontal headache x1 day.  Alert and oriented x3, no neurologic  deficits on examination. HA gradual in onset, not WHOL, similar symptoms in the past.  Labs essentially unremarkable, blood pressure and pain improved.  Do not feel further labs or imaging are warranted.  Stable for discharge with initiation of antihypertensive,  close outpatient follow-up with PMD and neurologist for further assessment.  Strict return precautions provided.           Diagnosis        Clinical Impression:       1.  Nonintractable episodic headache, unspecified headache type      2.  Essential hypertension         3.  Hypokalemia            Disposition: home         Follow-up Information               Follow up With  Specialties  Details  Why  [facility]  Emergency Medicine    If symptoms worsen  [place] [id number]   [phone]              [facility]    Schedule an appointment as soon as possible for a visit     [place]   [place]   Mathis [address]              Jeralyn Bennett, MD  Neurology  Schedule an appointment as soon as possible for a visit     [address]   [address]   [place] [address]   [phone]                      Patient's Medications        Start Taking           AMLODIPINE (NORVASC) 5 MG TABLET     Take 1 Tab by mouth daily.           POTASSIUM CHLORIDE (K-DUR, KLOR-CON) 20 MEQ TABLET     Take 1 Tab by mouth three (3) times daily for 3 days.       Continue Taking           ASPIRIN/SALICYLAMIDE/CAFFEINE (BC HEADACHE POWDER PO)     Take  by mouth.       These Medications have changed          No medications on file       Stop Taking           MEDROXYPROGESTERONE (DEPO-PROVERA) 150 MG/ML INJECTION     150 mg by IntraMUSCular route every three (3) months.           Dictation disclaimer:  Please note that this dictation was completed with Dragon, the computer voice recognition software.  Quite often unanticipated grammatical, syntax, homophones, and other  interpretive errors are inadvertently transcribed by the computer software.  Please disregard these errors.  Please excuse any errors that have escaped final proofreading.

## 2020-02-13 NOTE — ED Notes (Signed)
Patient c/o having frequent headaches.  She states headache began at  0400 today.  She states taking BC Powder approximately 1.5 hours ago.  She c/o headache pain radiating down into her neck and arms.  C/o tingling/ numbness to right fingers and hand.

## 2020-02-13 NOTE — ED Notes (Signed)
Pt ambulatory to bathroom with minimal assistance. States her headache is gone.

## 2020-02-13 NOTE — ED Provider Notes (Signed)
EMERGENCY DEPARTMENT HISTORY AND PHYSICAL EXAM    11:14 AM      Date: 02/13/2020  Patient Name: Kayla Huff    History of Presenting Illness     Chief Complaint   Patient presents with   ??? Headache   ??? Tingling         History Provided By: Patient    Additional History (Context): Kayla Huff is a 29 y.o. female with hx of HTN, DUB, migraines, and other noted PMH who presents with complaint of right frontal headache associated with neck pain and numbness and tingling radiating to her right hand since 4 AM this morning.  Patient notes gradual onset of symptoms.  Patient notes she attempted [facility] powder without relief of symptoms.  Patient notes history of similar headaches in the past, notes 1-2 episodes a week for the past 2 years.  Denies dizziness, changes in vision, slurred speech, facial droop, extremity weakness, chest pain, shortness of breath, nausea or vomiting, fever or chills, photophobia, head injury, worse headache of life.  Notes she was seen in February for similar headache, had lab work and a CT scan at that time.  Note she does not have a primary care physician or a neurologist.  LMP 5/7      PCP: None    Current Outpatient Medications   Medication Sig Dispense Refill   ??? aspirin/salicylamide/caffeine (BC HEADACHE POWDER PO) Take  by mouth.     ??? potassium chloride (K-DUR, KLOR-CON) 20 mEq tablet Take 1 Tab by mouth three (3) times daily for 3 days. 9 Tab 0   ??? amLODIPine (NORVASC) 5 mg tablet Take 1 Tab by mouth daily. 30 Tab 0       Past History     Past Medical History:  Past Medical History:   Diagnosis Date   ??? DUB (dysfunctional uterine bleeding)    ??? Headache    ??? Hypertension        Past Surgical History:  History reviewed. No pertinent surgical history.    Family History:  Family History   Problem Relation Age of Onset   ??? No Known Problems Mother    ??? Hypertension Father    ??? High Cholesterol Father    ??? Stroke Maternal Grandmother 59   ??? Diabetes Maternal Grandmother    ??? Hypertension  Maternal Grandmother    ??? Kidney Disease Maternal Grandmother    ??? Diabetes Paternal Grandmother    ??? Hypertension Paternal Grandmother    ??? Hypertension Maternal Grandfather    ??? Hypertension Paternal Grandfather        Social History:  Social History     Tobacco Use   ??? Smoking status: Never Smoker   ??? Smokeless tobacco: Never Used   Substance Use Topics   ??? Alcohol use: Yes     Alcohol/week: 0.0 standard drinks     Comment: occasionally   ??? Drug use: No       Allergies:  Allergies   Allergen Reactions   ??? Amoxicillin Rash         Review of Systems       Review of Systems   Constitutional: Negative for chills and fever.   Respiratory: Negative for shortness of breath.    Cardiovascular: Negative for chest pain.   Gastrointestinal: Negative for abdominal pain, nausea and vomiting.   Musculoskeletal: Positive for neck pain.   Skin: Negative for rash.   Neurological: Positive for numbness and headaches. Negative for weakness.  All other systems reviewed and are negative.        Physical Exam     Visit Vitals  BP (!) 153/102 (BP 1 Location: Left upper arm)   Pulse 73   Temp 98.6 ??F (37 ??C)   Resp 20   Ht 5\' 1"  (1.549 m)   Wt 74.8 kg (165 lb)   LMP 02/09/2020   SpO2 100%   BMI 31.18 kg/m??         Physical Exam  Vitals signs and nursing note reviewed.   Constitutional:       General: She is not in acute distress.     Appearance: Normal appearance. She is well-developed. She is not ill-appearing, toxic-appearing or diaphoretic.   HENT:      Head: Normocephalic and atraumatic.      Mouth/Throat:      Mouth: Mucous membranes are moist.   Eyes:      Pupils: Pupils are equal, round, and reactive to light.   Neck:      Musculoskeletal: Normal range of motion and neck supple.   Cardiovascular:      Rate and Rhythm: Normal rate and regular rhythm.      Heart sounds: Normal heart sounds. No murmur. No friction rub. No gallop.    Pulmonary:      Effort: Pulmonary effort is normal. No respiratory distress.      Breath sounds:  Normal breath sounds. No wheezing or rales.   Musculoskeletal: Normal range of motion.   Skin:     General: Skin is warm.      Findings: No rash.   Neurological:      General: No focal deficit present.      Mental Status: She is alert and oriented to person, place, and time.      Cranial Nerves: Cranial nerves are intact.      Sensory: Sensation is intact.      Motor: Motor function is intact.      Coordination: Coordination is intact.      Gait: Gait is intact.           Diagnostic Study Results     Labs -  Recent Results (from the past 12 hour(s))   CBC WITH AUTOMATED DIFF    Collection Time: 02/13/20 11:42 AM   Result Value Ref Range    WBC 5.8 4.6 - 13.2 K/uL    RBC 4.61 4.20 - 5.30 M/uL    HGB 12.3 12.0 - 16.0 g/dL    HCT 37.6 35.0 - 45.0 %    MCV 81.6 74.0 - 97.0 FL    MCH 26.7 24.0 - 34.0 PG    MCHC 32.7 31.0 - 37.0 g/dL    RDW 12.3 11.6 - 14.5 %    PLATELET 268 135 - 420 K/uL    MPV 11.1 9.2 - 11.8 FL    NEUTROPHILS 62 40 - 73 %    LYMPHOCYTES 28 21 - 52 %    MONOCYTES 7 3 - 10 %    EOSINOPHILS 2 0 - 5 %    BASOPHILS 1 0 - 2 %    ABS. NEUTROPHILS 3.6 1.8 - 8.0 K/UL    ABS. LYMPHOCYTES 1.6 0.9 - 3.6 K/UL    ABS. MONOCYTES 0.4 0.05 - 1.2 K/UL    ABS. EOSINOPHILS 0.1 0.0 - 0.4 K/UL    ABS. BASOPHILS 0.0 0.0 - 0.1 K/UL    DF AUTOMATED     METABOLIC PANEL, BASIC  Collection Time: 02/13/20 11:42 AM   Result Value Ref Range    Sodium 140 136 - 145 mmol/L    Potassium 3.4 (L) 3.5 - 5.5 mmol/L    Chloride 109 100 - 111 mmol/L    CO2 27 21 - 32 mmol/L    Anion gap 4 3.0 - 18 mmol/L    Glucose 87 74 - 99 mg/dL    BUN 11 7.0 - 18 MG/DL    Creatinine 0.86 0.6 - 1.3 MG/DL    BUN/Creatinine ratio 16 12 - 20      GFR est AA >60 >60 ml/min/1.1m2    GFR est non-AA >60 >60 ml/min/1.81m2    Calcium 8.5 8.5 - 10.1 MG/DL   HCG QL SERUM    Collection Time: 02/13/20 11:42 AM   Result Value Ref Range    HCG, Ql. Negative NEG         Radiologic Studies -   No orders to display         Medical Decision Making   I am the first  provider for this patient.    I reviewed the vital signs, available nursing notes, past medical history, past surgical history, family history and social history.    Vital Signs-Reviewed the patient's vital signs.    Records Reviewed: Nursing Notes and Old Medical Records (Time of Review: 11:14 AM)    ED Course: Progress Notes, Reevaluation, and Consults:  12:45 PM: Pt resting comfortably, notes pain has reduced from 8/10 to 3/10. Feels well enough to go home.    Notes she does not take any medication for her blood pressure, will initiate anti-hypertensive in the ED.      Reviewed results with patient. Discussed need for close outpatient follow-up this week for reassessment. Discussed strict return precautions, including dizziness, changes in vision, or any other medical concerns.    One or more blood pressure readings were noted elevated during the patient's presentation in the emergency department today.  This abnormal reading has been cited in the patients' diagnosis, and they have been encouraged to follow up with their primary care physician, or referred to a consultant for further evaluation and treatment.    Provider Notes (Medical Decision Making): 29 year old female with history of hypertension, headaches who presents to the ED due to right frontal headache x1 day.  Alert and oriented x3, no neurologic deficits on examination. HA gradual in onset, not WHOL, similar symptoms in the past.  Labs essentially unremarkable, blood pressure and pain improved.  Do not feel further labs or imaging are warranted.  Stable for discharge with initiation of antihypertensive, close outpatient follow-up with PMD and neurologist for further assessment.  Strict return precautions provided.      Diagnosis     Clinical Impression:   1. Nonintractable episodic headache, unspecified headache type    2. Essential hypertension    3. Hypokalemia        Disposition: home     Follow-up Information     Follow up With Specialties  Details Why Contact Info    [facility] EM[facility] Emergency Medicine  If symptoms worsen [address] [place] [address]  [phone]    [facility]  Schedule an appointment as soon as possible for a visit   [address] [place]  [address]  [place] [address]  [phone]    Vernie Murders, MD Neurology Schedule an appointment as soon as possible for a visit   [address]  [address]  [address]  [place] [address]  [phone]             Patient's Medications   Start Taking    AMLODIPINE (NORVASC) 5 MG TABLET    Take 1 Tab by mouth daily.    POTASSIUM CHLORIDE (K-DUR, KLOR-CON) 20 MEQ TABLET    Take 1 Tab by mouth three (3) times daily for 3 days.   Continue Taking    ASPIRIN/SALICYLAMIDE/CAFFEINE (BC HEADACHE POWDER PO)    Take  by mouth.   These Medications have changed    No medications on file   Stop Taking    MEDROXYPROGESTERONE (DEPO-PROVERA) 150 MG/ML INJECTION    150 mg by IntraMUSCular route every three (3) months.       Dictation disclaimer:  Please note that this dictation was completed with Dragon, the computer voice recognition software.  Quite often unanticipated grammatical, syntax, homophones, and other interpretive errors are inadvertently transcribed by the computer software.  Please disregard these errors.  Please excuse any errors that have escaped final proofreading.

## 2020-02-13 NOTE — ED Notes (Signed)
Pt ambulatory to bathroom with minimal assistance. States her headache is gone.

## 2020-02-13 NOTE — ED Triage Notes (Signed)
Patient c/o having frequent headaches.  She states headache began at  0400 today.  She states taking BC Powder approximately 1.5 hours ago.  She c/o headache pain radiating down into her neck and arms.  C/o tingling/ numbness to right fingers and hand.
